# Patient Record
Sex: Female | Born: 1945 | Race: Black or African American | Hispanic: No | Marital: Married | Smoking: Former smoker
Health system: Southern US, Community
[De-identification: ages and names within clinical notes are randomized; demographics above are authoritative.]

## PROBLEM LIST (undated history)

## (undated) DIAGNOSIS — Z8489 Family history of other specified conditions: Secondary | ICD-10-CM

## (undated) DIAGNOSIS — R059 Cough, unspecified: Secondary | ICD-10-CM

## (undated) DIAGNOSIS — K219 Gastro-esophageal reflux disease without esophagitis: Secondary | ICD-10-CM

## (undated) DIAGNOSIS — D649 Anemia, unspecified: Secondary | ICD-10-CM

## (undated) DIAGNOSIS — R05 Cough: Secondary | ICD-10-CM

## (undated) DIAGNOSIS — E785 Hyperlipidemia, unspecified: Secondary | ICD-10-CM

## (undated) DIAGNOSIS — J45909 Unspecified asthma, uncomplicated: Secondary | ICD-10-CM

## (undated) HISTORY — DX: Cough, unspecified: R05.9

## (undated) HISTORY — DX: Hyperlipidemia, unspecified: E78.5

## (undated) HISTORY — PX: ABDOMINAL HYSTERECTOMY: SHX81

## (undated) HISTORY — DX: Gastro-esophageal reflux disease without esophagitis: K21.9

## (undated) HISTORY — DX: Cough: R05

---

## 1999-12-04 ENCOUNTER — Encounter: Payer: Self-pay | Admitting: Internal Medicine

## 1999-12-04 ENCOUNTER — Other Ambulatory Visit: Admission: RE | Admit: 1999-12-04 | Discharge: 1999-12-04 | Payer: Self-pay | Admitting: Gynecology

## 1999-12-04 ENCOUNTER — Encounter: Admission: RE | Admit: 1999-12-04 | Discharge: 1999-12-04 | Payer: Self-pay | Admitting: Internal Medicine

## 2000-12-24 ENCOUNTER — Other Ambulatory Visit: Admission: RE | Admit: 2000-12-24 | Discharge: 2000-12-24 | Payer: Self-pay | Admitting: Gynecology

## 2000-12-24 ENCOUNTER — Encounter: Payer: Self-pay | Admitting: Internal Medicine

## 2000-12-24 ENCOUNTER — Encounter: Admission: RE | Admit: 2000-12-24 | Discharge: 2000-12-24 | Payer: Self-pay | Admitting: Internal Medicine

## 2002-01-18 ENCOUNTER — Other Ambulatory Visit: Admission: RE | Admit: 2002-01-18 | Discharge: 2002-01-18 | Payer: Self-pay | Admitting: Gynecology

## 2002-04-05 ENCOUNTER — Encounter: Payer: Self-pay | Admitting: Gynecology

## 2002-04-05 ENCOUNTER — Encounter: Admission: RE | Admit: 2002-04-05 | Discharge: 2002-04-05 | Payer: Self-pay | Admitting: Gynecology

## 2003-03-31 ENCOUNTER — Other Ambulatory Visit: Admission: RE | Admit: 2003-03-31 | Discharge: 2003-03-31 | Payer: Self-pay | Admitting: Gynecology

## 2003-04-27 ENCOUNTER — Encounter: Admission: RE | Admit: 2003-04-27 | Discharge: 2003-04-27 | Payer: Self-pay | Admitting: Internal Medicine

## 2003-04-27 ENCOUNTER — Encounter: Payer: Self-pay | Admitting: Internal Medicine

## 2003-12-27 ENCOUNTER — Encounter: Payer: Self-pay | Admitting: Critical Care Medicine

## 2004-02-17 ENCOUNTER — Ambulatory Visit (HOSPITAL_COMMUNITY): Admission: RE | Admit: 2004-02-17 | Discharge: 2004-02-17 | Payer: Self-pay | Admitting: Internal Medicine

## 2004-05-17 ENCOUNTER — Other Ambulatory Visit: Admission: RE | Admit: 2004-05-17 | Discharge: 2004-05-17 | Payer: Self-pay | Admitting: Gynecology

## 2004-05-25 ENCOUNTER — Encounter: Admission: RE | Admit: 2004-05-25 | Discharge: 2004-05-25 | Payer: Self-pay | Admitting: Gynecology

## 2005-04-30 ENCOUNTER — Other Ambulatory Visit: Admission: RE | Admit: 2005-04-30 | Discharge: 2005-04-30 | Payer: Self-pay | Admitting: Gynecology

## 2005-07-05 ENCOUNTER — Encounter: Admission: RE | Admit: 2005-07-05 | Discharge: 2005-07-05 | Payer: Self-pay | Admitting: Internal Medicine

## 2005-07-10 ENCOUNTER — Encounter: Admission: RE | Admit: 2005-07-10 | Discharge: 2005-07-10 | Payer: Self-pay | Admitting: Interventional Cardiology

## 2005-07-11 ENCOUNTER — Inpatient Hospital Stay (HOSPITAL_BASED_OUTPATIENT_CLINIC_OR_DEPARTMENT_OTHER): Admission: RE | Admit: 2005-07-11 | Discharge: 2005-07-11 | Payer: Self-pay | Admitting: Interventional Cardiology

## 2005-08-13 ENCOUNTER — Encounter: Admission: RE | Admit: 2005-08-13 | Discharge: 2005-08-13 | Payer: Self-pay | Admitting: Internal Medicine

## 2005-12-23 HISTORY — PX: OTHER SURGICAL HISTORY: SHX169

## 2006-03-13 ENCOUNTER — Ambulatory Visit: Payer: Self-pay | Admitting: Critical Care Medicine

## 2006-05-20 ENCOUNTER — Other Ambulatory Visit: Admission: RE | Admit: 2006-05-20 | Discharge: 2006-05-20 | Payer: Self-pay | Admitting: Gynecology

## 2006-08-22 ENCOUNTER — Encounter: Admission: RE | Admit: 2006-08-22 | Discharge: 2006-08-22 | Payer: Self-pay | Admitting: Gynecology

## 2007-05-27 ENCOUNTER — Other Ambulatory Visit: Admission: RE | Admit: 2007-05-27 | Discharge: 2007-05-27 | Payer: Self-pay | Admitting: Gynecology

## 2007-07-13 ENCOUNTER — Encounter (INDEPENDENT_AMBULATORY_CARE_PROVIDER_SITE_OTHER): Payer: Self-pay | Admitting: Gynecology

## 2007-07-13 ENCOUNTER — Ambulatory Visit (HOSPITAL_COMMUNITY): Admission: RE | Admit: 2007-07-13 | Discharge: 2007-07-14 | Payer: Self-pay | Admitting: Gynecology

## 2007-09-15 ENCOUNTER — Encounter: Admission: RE | Admit: 2007-09-15 | Discharge: 2007-09-15 | Payer: Self-pay | Admitting: Gynecology

## 2007-12-24 HISTORY — PX: FOOT SURGERY: SHX648

## 2008-09-27 ENCOUNTER — Encounter: Admission: RE | Admit: 2008-09-27 | Discharge: 2008-09-27 | Payer: Self-pay | Admitting: Gynecology

## 2009-10-19 ENCOUNTER — Encounter: Admission: RE | Admit: 2009-10-19 | Discharge: 2009-10-19 | Payer: Self-pay | Admitting: *Deleted

## 2010-02-02 ENCOUNTER — Encounter: Admission: RE | Admit: 2010-02-02 | Discharge: 2010-02-02 | Payer: Self-pay | Admitting: Gastroenterology

## 2010-03-26 DIAGNOSIS — K219 Gastro-esophageal reflux disease without esophagitis: Secondary | ICD-10-CM | POA: Insufficient documentation

## 2010-03-26 DIAGNOSIS — Z8679 Personal history of other diseases of the circulatory system: Secondary | ICD-10-CM | POA: Insufficient documentation

## 2010-03-26 DIAGNOSIS — E785 Hyperlipidemia, unspecified: Secondary | ICD-10-CM | POA: Insufficient documentation

## 2010-03-27 ENCOUNTER — Ambulatory Visit: Payer: Self-pay | Admitting: Critical Care Medicine

## 2010-03-27 DIAGNOSIS — J329 Chronic sinusitis, unspecified: Secondary | ICD-10-CM | POA: Insufficient documentation

## 2010-05-09 ENCOUNTER — Ambulatory Visit: Payer: Self-pay | Admitting: Critical Care Medicine

## 2010-10-24 ENCOUNTER — Encounter: Admission: RE | Admit: 2010-10-24 | Discharge: 2010-10-24 | Payer: Self-pay | Admitting: *Deleted

## 2011-01-13 ENCOUNTER — Encounter: Payer: Self-pay | Admitting: Gastroenterology

## 2011-01-22 NOTE — Assessment & Plan Note (Signed)
Summary: Pulmonary OV   Copy to:  Dr. Sharene Butters Primary Provider/Referring Provider:  Dr. Sharene Butters  CC:  1 month sinus follow up.  Pt states sinuses are better.  Denies any complaints today.Marland Kitchen  History of Present Illness: [name] with chronic sinusitis.  This pt was seen previously in 2005 for chronic sinusitis.   Pt developed recurrent symptoms recently and rx by PCP 11/10 with augmentin for sinusitis. This improved only for pt to recurr this spring.   She has seen GI and EGD neg for reflux. She was on PPI now off.  She notes sore throat, cough, nasal congestion, thick yellow mucous out of nose,  no dyspnea. She sleeps on two pillows.    Pt referred by PCP for sinus reeval. Pt denies any increase in rescue therapy over baseline, denies waking up needing it or having any early am or nocturnal exacerbations of coughing/wheezing/or dyspnea. Pt denies any significant  fever, chills, sweats, unintended weight loss, pleurtic or exertional chest pain, orthopnea PND, or leg swelling   May 09, 2010 3:14 PM Is better and has less cough.  Now not much nasal drainage  No new issues Pt denies any significant sore throat, nasal congestion or excess secretions, fever, chills, sweats, unintended weight loss, pleurtic or exertional chest pain, orthopnea PND, or leg swelling Pt denies any increase in rescue therapy over baseline, denies waking up needing it or having any early am or nocturnal exacerbations of coughing/wheezing/or dyspnea.   Preventive Screening-Counseling & Management  Alcohol-Tobacco     Smoking Status: quit > 6 months  Current Medications (verified): 1)  Aspir-Low 81 Mg Tbec (Aspirin) .... Once Daily 2)  Pravastatin Sodium 40 Mg Tabs (Pravastatin Sodium) .... Once Daily 3)  Multivitamins  Tabs (Multiple Vitamin) .... Once Daily 4)  Calcium-Vitamin D 600-200 Mg-Unit Tabs (Calcium-Vitamin D) .... Once Daily 5)  Nasonex 50 Mcg/act  Susp (Mometasone Furoate) .... Two Puffs  Each Nostril Daily  Allergies (verified): No Known Drug Allergies  Past History:  Past medical, surgical, family and social histories (including risk factors) reviewed, and no changes noted (except as noted below).  Past Medical History: Reviewed history from 03/26/2010 and no changes required. GERD (ICD-530.81) CHEST PAIN, HX OF (ICD-V12.50) HYPERLIPIDEMIA (ICD-272.4) Divertiulosis  Past Surgical History: Reviewed history from 03/27/2010 and no changes required. hysterectomy July 2009 foot surgery 2010  Family History: Reviewed history from 03/27/2010 and no changes required. Father- deceased:  HTN, PNA, Alzheimers Mother- Deceased:  stroke, HTN sister 1 - CAD, HTN Daughter - HTN Son - Bipolar disorder Daughter, son- allergies Granddaughter-asthma  Social History: Reviewed history from 03/27/2010 and no changes required. Married Unemployed Patient states former smoker.   Quit 1980's.  < 1/2 ppd x 85yrs Smoking Status:  quit > 6 months  Review of Systems  The patient denies shortness of breath with activity, shortness of breath at rest, productive cough, non-productive cough, coughing up blood, chest pain, irregular heartbeats, acid heartburn, indigestion, loss of appetite, weight change, abdominal pain, difficulty swallowing, sore throat, tooth/dental problems, headaches, nasal congestion/difficulty breathing through nose, sneezing, itching, ear ache, anxiety, depression, hand/feet swelling, joint stiffness or pain, rash, change in color of mucus, and fever.    Vital Signs:  Patient profile:   65 year old female Height:      62 inches Weight:      188 pounds BMI:     34.51 O2 Sat:      98 % on Room air Temp:  97.4 degrees F oral Pulse rate:   86 / minute BP sitting:   118 / 72  (right arm) Cuff size:   regular  Vitals Entered By: Gweneth Dimitri RN (May 09, 2010 3:08 PM)  O2 Flow:  Room air CC: 1 month sinus follow up.  Pt states sinuses are better.  Denies  any complaints today. Comments Medications reviewed with patient Daytime contact number verified with patient. Gweneth Dimitri RN  May 09, 2010 3:10 PM    Physical Exam  Additional Exam:  Gen: Pleasant, well-nourished, in no distress,  normal affect ENT: No lesions,  mouth clear,  oropharynx  posterior nasal drip, nares with reduced erythema and resolved purulence in nares Neck: No JVD, no TMG, no carotid bruits Lungs: No use of accessory muscles, no dullness to percussion, clear without rales or rhonchi Cardiovascular: RRR, heart sounds normal, no murmur or gallops, no peripheral edema Abdomen: soft and NT, no HSM,  BS normal Musculoskeletal: No deformities, no cyanosis or clubbing Neuro: alert, non focal Skin: Warm, no lesions or rashes    Impression & Recommendations:  Problem # 1:  SINUSITIS (ICD-473.9) Assessment Improved  Acute on chronic sinusitis due to allergic rhinitis improved  Note normal spirometry thus doubt any primary lung disease plan cont nasal hygiene no further ABX  consider ENT eval if recurs sinusitis  Complete Medication List: 1)  Aspir-low 81 Mg Tbec (Aspirin) .... Once daily 2)  Pravastatin Sodium 40 Mg Tabs (Pravastatin sodium) .... Once daily 3)  Multivitamins Tabs (Multiple vitamin) .... Once daily 4)  Calcium-vitamin D 600-200 Mg-unit Tabs (Calcium-vitamin d) .... Once daily 5)  Nasonex 50 Mcg/act Susp (Mometasone furoate) .... Two puffs each nostril daily  Other Orders: Est. Patient Level III (57322)  Patient Instructions: 1)  When you finish the current nasonex stay off and see how you do 2)  Return as needed  Appended Document: Pulmonary OV fax Dr Sharene Butters  [facility]

## 2011-01-22 NOTE — Assessment & Plan Note (Signed)
Summary: Pulmonary OV   Copy to:  Dr. Sharene Boyd Primary Provider/Referring Provider:  Dr. Sharene Boyd  CC:  Pulmonary Consult.Marland Kitchen  History of Present Illness: [name] with chronic sinusitis.  This pt was seen previously in 2005 for chronic sinusitis.   Pt developed recurrent symptoms recently and rx by PCP 11/10 with augmentin for sinusitis. This improved only for pt to recurr this spring.   She has seen GI and EGD neg for reflux. She was on PPI now off.  She notes sore throat, cough, nasal congestion, thick yellow mucous out of nose,  no dyspnea. She sleeps on two pillows.    Pt referred by PCP for sinus reeval. Pt denies any increase in rescue therapy over baseline, denies waking up needing it or having any early am or nocturnal exacerbations of coughing/wheezing/or dyspnea. Pt denies any significant  fever, chills, sweats, unintended weight loss, pleurtic or exertional chest pain, orthopnea PND, or leg swelling     Preventive Screening-Counseling & Management  Alcohol-Tobacco     Smoking Status: quit     Year Quit: 1970  Current Medications (verified): 1)  Aspir-Low 81 Mg Tbec (Aspirin) .... Once Daily 2)  Pravastatin Sodium 40 Mg Tabs (Pravastatin Sodium) .... Once Daily 3)  Multivitamins  Tabs (Multiple Vitamin) .... Once Daily 4)  Calcium-Vitamin D 600-200 Mg-Unit Tabs (Calcium-Vitamin D) .... Once Daily  Allergies (verified): No Known Drug Allergies  Past History:  Past medical, surgical, family and social histories (including risk factors) reviewed, and no changes noted (except as noted below).  Past Medical History: Reviewed history from 03/26/2010 and no changes required. GERD (ICD-530.81) CHEST PAIN, HX OF (ICD-V12.50) HYPERLIPIDEMIA (ICD-272.4) Divertiulosis  Past Surgical History: hysterectomy July 2009 foot surgery 2010  Family History: Reviewed history from 03/26/2010 and no changes required. Father- deceased:  HTN, PNA, Alzheimers Mother-  Deceased:  stroke, HTN sister 1 - CAD, HTN Daughter - HTN Son - Bipolar disorder Daughter, son- allergies Granddaughter-asthma  Social History: Reviewed history from 03/26/2010 and no changes required. Married Unemployed Patient states former smoker.   Quit 1980's.  < 1/2 ppd x 43yrs Smoking Status:  quit  Review of Systems       The patient complains of shortness of breath with activity, productive cough, non-productive cough, difficulty swallowing, sore throat, nasal congestion/difficulty breathing through nose, sneezing, and change in color of mucus.  The patient denies shortness of breath at rest, coughing up blood, chest pain, irregular heartbeats, acid heartburn, indigestion, loss of appetite, weight change, abdominal pain, tooth/dental problems, headaches, itching, ear ache, anxiety, depression, hand/feet swelling, joint stiffness or pain, rash, and fever.    Vital Signs:  Patient profile:   65 year old female Height:      62 inches Weight:      184.50 pounds BMI:     33.87 O2 Sat:      99 % on Room air Temp:     97.7 degrees F oral Pulse rate:   102 / minute BP sitting:   122 / 70  (left arm) Cuff size:   regular  Vitals Entered By: Doris Dimitri RN (March 27, 2010 3:48 PM)  O2 Flow:  Room air CC: Pulmonary Consult. Comments Medications reviewed with patient Daytime contact number verified with patient. Doris Dimitri RN  March 27, 2010 3:47 PM    Physical Exam  Additional Exam:  Gen: Pleasant, well-nourished, in no distress,  normal affect ENT: No lesions,  mouth clear,  oropharynx  posterior nasal  drip, nares with erythema and purulence R>L nares Neck: No JVD, no TMG, no carotid bruits Lungs: No use of accessory muscles, no dullness to percussion, clear without rales or rhonchi Cardiovascular: RRR, heart sounds normal, no murmur or gallops, no peripheral edema Abdomen: soft and NT, no HSM,  BS normal Musculoskeletal: No deformities, no cyanosis or  clubbing Neuro: alert, non focal Skin: Warm, no lesions or rashes    Pulmonary Function Test Date: 03/27/2010 4:18 PM Gender: Female  Pre-Spirometry FVC    Value: 1.98 L/min   % Pred: 83.80 % FEV1    Value: 1.43 L     Pred: 1.85 L     % Pred: 77.60 % FEV1/FVC  Value: 72.37 %     % Pred: 91.60 %  Impression & Recommendations:  Problem # 1:  SINUSITIS (ICD-473.9) Assessment Deteriorated Acute on chronic sinusitis due to allergic rhinitis Note normal spirometry thus doubt any primary lung disease The pt has not seen ENT plan Prednisone 10mg  4 each am x3days, 3 x 3days, 2 x 3days, 1 x 3days then stop Nasonex two sprays each nostril daily augmentin one twice daily for 7 days Use the NeilMed nasal rinse at least daily   Medications Added to Medication List This Visit: 1)  Aspir-low 81 Mg Tbec (Aspirin) .... Once daily 2)  Pravastatin Sodium 40 Mg Tabs (Pravastatin sodium) .... Once daily 3)  Multivitamins Tabs (Multiple vitamin) .... Once daily 4)  Calcium-vitamin D 600-200 Mg-unit Tabs (Calcium-vitamin d) .... Once daily 5)  Amoxicillin-pot Clavulanate 875-125 Mg Tabs (Amoxicillin-pot clavulanate) .... One by mouth two times a day 6)  Prednisone 10 Mg Tabs (Prednisone) .... Take as directed 4 each am x3days, 3 x 3days, 2 x 3days, 1 x 3days then stop 7)  Nasonex 50 Mcg/act Susp (Mometasone furoate) .... Two puffs each nostril daily  Complete Medication List: 1)  Aspir-low 81 Mg Tbec (Aspirin) .... Once daily 2)  Pravastatin Sodium 40 Mg Tabs (Pravastatin sodium) .... Once daily 3)  Multivitamins Tabs (Multiple vitamin) .... Once daily 4)  Calcium-vitamin D 600-200 Mg-unit Tabs (Calcium-vitamin d) .... Once daily 5)  Amoxicillin-pot Clavulanate 875-125 Mg Tabs (Amoxicillin-pot clavulanate) .... One by mouth two times a day 6)  Prednisone 10 Mg Tabs (Prednisone) .... Take as directed 4 each am x3days, 3 x 3days, 2 x 3days, 1 x 3days then stop 7)  Nasonex 50 Mcg/act Susp  (Mometasone furoate) .... Two puffs each nostril daily  Other Orders: New Patient Level V (16109)  Patient Instructions: 1)  Prednisone 10mg  4 each am x3days, 3 x 3days, 2 x 3days, 1 x 3days then stop 2)  Nasonex two sprays each nostril daily 3)  augmentin one twice daily for 7 days 4)  Use the NeilMed nasal rinse at least daily washing out both nares thoroughly.  Place one packet of Sinus Wash ingredients into the nasal wash bottle then fill to the dotted line with lukewarm tap water.  Lean over the sink and rinse each nostril out thoroughly and avoid letting the rinse go into the throat.   5)  Return one month Prescriptions: NASONEX 50 MCG/ACT  SUSP (MOMETASONE FUROATE) Two puffs each nostril daily  #1 x 6   Entered and Authorized by:   Storm Frisk MD   Signed by:   Storm Frisk MD on 03/27/2010   Method used:   Electronically to        [facility]  [address] [phone]* (retail)  [address]       [place], [place]  [address]       Ph: [phone]       Fax: [phone]   RxID:   [id number] PREDNISONE 10 MG  TABS (PREDNISONE) Take as directed 4 each am x3days, 3 x 3days, 2 x 3days, 1 x 3days then stop  #30 x 0   Entered and Authorized by:   Storm Frisk MD   Signed by:   Storm Frisk MD on 03/27/2010   Method used:   Electronically to        [facility]  [address] [phone]* (retail)       [address]       [place], [place]  [address]       Ph: [phone]       Fax: [phone]   RxID:   [id number] AMOXICILLIN-POT CLAVULANATE 875-125 MG TABS (AMOXICILLIN-POT CLAVULANATE) One by mouth two times a day  #7 x 0   Entered and Authorized by:   Storm Frisk MD   Signed by:   Storm Frisk MD on 03/27/2010   Method used:   [facility]        [facility]  [address] [phone]* (retail)       [address]       [place], [place]  [address]       Ph: [phone]       Fax: [phone]    RxID:   [id number]    Immunization History:  Influenza Immunization History:    Influenza:  historical (11/22/2009)    CardioPerfect Spirometry  ID: [id number] Patient: Doris Boyd, Doris Boyd DOB: March 14, 1946 Age: 65 Years Old Sex: Female Race: Black Physician: Raneen Jaffer Height: 62 Weight: 184.50 Status: Confirmed Past Medical History:  GERD (ICD-530.81) CHEST PAIN, HX OF (ICD-V12.50) HYPERLIPIDEMIA (ICD-272.4) Divertiulosis   Recorded: 03/27/2010 4:18 PM  Parameter  Measured Predicted %Predicted FVC     1.98        2.36        83.80 FEV1     1.43        1.85        77.60 FEV1%   72.37        79.01        91.60 PEF    5.11        5.11        99.80   Comments: Mild restriction, no obstruction  Interpretation: Pre: FVC= 1.98L FEV1= 1.43L FEV1%= 72.4% 1.43/1.98 FEV1/FVC (03/27/2010 4:24:19 PM), Within normal limits     Appended Document: Pulmonary OV fax [facility]

## 2011-01-22 NOTE — Progress Notes (Signed)
Summary: [facility]   Imported By: Sherian Rein 03/28/2010 10:33:19  _____________________________________________________________________  External Attachment:    Type:   Image     Comment:   External Document

## 2011-05-07 NOTE — Op Note (Signed)
NAME:  Doris Boyd, AXTON NO.:  [id number]   MEDICAL RECORD NO.:  [id number]          PATIENT TYPE:  AMB   LOCATION:  DAY                          FACILITY:  [facility]   PHYSICIAN:  Gretta Cool, M.D. DATE OF BIRTH:  [birth year]   DATE OF PROCEDURE:  07/13/2007  DATE OF DISCHARGE:                               OPERATIVE REPORT   PREOPERATIVE DIAGNOSIS:  Multiple uterine leiomyomata, with enlargement  and continued postmenopausal bleeding.   POSTOPERATIVE DIAGNOSIS:  Multiple uterine leiomyomata, with enlargement  and continued postmenopausal bleeding.   PROCEDURE:  Supracervical hysterectomy, bilateral salpingo-oophorectomy.   SURGEON:  Gretta Cool, M.D.   ASSISTANT:  Almedia Balls. Randell Patient, M.D.   ANESTHESIA:  General orotracheal.   DESCRIPTION OF PROCEDURE:  Under excellent general anesthesia, the  patient was prepped and draped in lithotomy position and with Foley  catheter draining her bladder.  A Pfannenstiel incision was made and  extended through the fascia.  The rectus muscle was then separated in  midline and the peritoneum opened.  There were no abnormalities  identified in the upper abdomen.  Examination of the pelvis revealed  multiple uterine leiomyomata distorting the fundus and body of the  uterus.  They were not involving the cervix or any other abnormality.  No endometriosis or other concerns. Both ovaries were quite small and  inactive in appearance.  At this point, attention was turned to the  hysterectomy.  There was minimal scarring from her previous cesarean  section delivery.  The round ligaments were transected by cautery.  The  anterior leaf of the broad ligament was then opened and the bladder  pushed off the lower segment.  The infundibulopelvic vessels were then  skeletonized, clamped, cut, sutured, and tied with 0 Vicryl.  A second  free tie was used to doubly ligate into the infundibulopelvic.  At this  point, the uterine vessels were  clamped, cut, sutured, and tied with 0  Vicryl.  The cardinal and uterosacral upper portion was then clamped was  straight Masterson clamps, cut, sutured, and tied.  At this point, the  cervix was incised in reverse cone fashion so as to remove most of the  central core of the cervix but leave the structure of the cervix intact.  At this point, the uterine cervix was treated by cautery in the  endocervical canal so as to eliminate any residual endometrial tissue.  The cervix was then closed with a running suture of #0 Vicryl.  At this  point, all the bleeding was well controlled.  The pelvic peritoneum was  then closed with a running suture of 2-0 Monocryl.  The pelvis was  irrigated with lactated Ringer's to remove any debris.  There was no  residual bleeding.  At this point, the abdominal peritoneum was closed  with a running suture of 0 Monocryl.  The rectus muscles were then  plicated in the midline with the same suture of 0 Monocryl.  The fascia  was approximated from each angle to the midline with a running  suture of #0 Vicryl.  The subcutaneous tissue was  approximated with  interrupted sutures of 3-0 Vicryl.  Skin closed with skin staples and  Steri-Strips.  At the end of the procedure, sponge and lap counts were  correct.  There were no complications.  The patient  returned to the  recovery room in excellent condition.           ______________________________  Gretta Cool, M.D.     CWL/MEDQ  D:  07/13/2007  T:  07/13/2007  Job:  [id number]   cc:   Dr. Birder Robson  [facility]

## 2011-05-07 NOTE — H&P (Signed)
NAME:  Doris Boyd, Doris Boyd NO.:  [id number]   MEDICAL RECORD NO.:  [id number]          PATIENT TYPE:  AMB   LOCATION:  DAY                          FACILITY:  [facility]   PHYSICIAN:  Gretta Cool, M.D. DATE OF BIRTH:  February 28, 1946   DATE OF ADMISSION:  DATE OF DISCHARGE:                              HISTORY & PHYSICAL   CHIEF COMPLAINT:  Abnormal uterine bleeding.   HISTORY OF PRESENT ILLNESS:  A 65 year old, black, gravida 3, para 3  with recurrent abnormal uterine bleeding.  She has known uterine  leiomyomata.  She has had no bleeding since January 2006 until  recurrence of bleeding Apr 23, 2007.  She has known uterine leiomyomata.  On recent exam by ultrasound her fibroids are known to have grown  larger.  Because of the increase in size of her uterus, she is now  admitted for supracervical hysterectomy and salpingo-oophorectomy.  On  ultrasound exam, her endometrial thickness is only 2 mm, very difficult  to identify throughout much of the course of her uterus.  Neither ovary  can be visualized.   PAST MEDICAL HISTORY:  Usual childhood diseases without sequelae.   MEDICAL ILLNESSES:  None.   HOSPITALIZATIONS:  For childbirth only.   PRESENT MEDICATIONS:  Prevacid one b.i.d., Climara 0.25 and Prometrium  every other month.   PAST SURGICAL HISTORY:  Cesarean section delivery in 1981.   FAMILY HISTORY:  Father is living in good health.  Mother had stroke at  age 76 and died of the stroke.  One sister has coronary artery disease,  one son died of congenital heart disease.  No other known familial  tendency.   ALLERGIES:  CIPRO.   REVIEW OF SYSTEMS:  HEENT: Denies symptoms. CARDIORESPIRATORY:  Denies  asthma, cough, bronchitis, shortness of breath.  GI/GU:  Denies  frequency, urgency, dysuria, change in bowel habits or food intolerance.   PHYSICAL EXAM:  GENERAL:  A well-developed, well-nourished, black  female, BMI of 34 at 5 feet 2 and 186 pounds.  HEENT:   Pupils equal round and reactive to light and accommodation.  Fundi not examined.  Oropharynx clear.  NECK:  Supple without mass or enlargement.  CHEST:  Clear P to A.  HEART:  Regular rhythm without murmur or cardiac enlargement.  ABDOMEN:  Soft with an enlarged panniculus.  She has no palpable  organomegaly.  PELVIC:  External genitalia normal female. Vagina is thin, diminished  estrogen effect.  Cervix is parous, clean.  Uterus is approximately 10-  12 weeks size, grossly knobby and irregular. Rectovaginal exam confirms.  EXTREMITIES:  Negative.  NEUROLOGIC:  Physiologic.   IMPRESSION:  1. Abnormal uterine bleeding, recurrent secondary to uterine      leiomyomata.  No evidence of endometrial pathology.  2. Persistent intense perimenopausal symptoms on low-dose HT.   PLAN:  Supracervical hysterectomy, salpingo-oophorectomy, possible total  hysterectomy.           ______________________________  Gretta Cool, M.D.     CWL/MEDQ  D:  07/12/2007  T:  07/13/2007  Job:  [id number]   cc:   Charise Carwin  Deboraha Sprang, MD

## 2011-05-10 NOTE — Cardiovascular Report (Signed)
NAME:  Doris Boyd, Doris Boyd NO.:  [id number]   MEDICAL RECORD NO.:  [id number]          PATIENT TYPE:  OIB   LOCATION:  6501                         FACILITY:  [facility]   PHYSICIAN:  Lyn Records, M.D.   DATE OF BIRTH:  17-Sep-1946   DATE OF PROCEDURE:  07/11/2005  DATE OF DISCHARGE:                              CARDIAC CATHETERIZATION   INDICATIONS:  Recurring chest, throat, neck, and jaw discomfort lasting 5-30  minutes with qualitative features consistent with ischemic heart disease.  Because of the recurring nature of the discomfort, stress testing was not  performed and it was felt that coronary angiography and delineation of  coronary anatomy was the most appropriate test.   PROCEDURES PERFORMED:  1.  Left heart cath.  2.  Selective coronary angiography.  3.  Left ventriculography.   DESCRIPTION:  After informed consent, a 4-French sheath was placed in the  right femoral using the modified Seldinger technique. Xylocaine 2% was used  for local anesthetic infiltration. A 4-French A2 multipurpose catheter was  then used for hemodynamic recordings, left ventriculography by hand  injection, and selective left and right coronary angiography. The patient  tolerated the procedure without complications.   RESULTS:  1.  Hemodynamic data:      1.  Aortic pressure 101/62.      2.  Left ventricular pressure 110/3 mmHg.  2.  Left ventriculography: Left ventricular cavity size, systolic function,      and mitral valve function were normal. EF estimated to be 60%.  3.  Coronary angiography.      1.  Left main coronary: The left main is relatively short and is free of          any obstruction.      2.  Left anterior descending coronary: There is a large somewhat          tortuous vessel that wraps around the left ventricular apex. It          gives origin to two diagonal branches. The LAD is smooth and free of          obstructive lesions.      3.  Circumflex artery:  Circumflex artery is large and gives origin to          two dominant obtuse marginal's. The first obtuse marginal          trifurcates. The circumflex is normal.      4.  Right coronary: The right coronary is a relatively small vessel free          of any significant obstruction. No obstructive lesions are noted.          The right coronary gives to the PDA and two small left ventricular          branches.   CONCLUSION:  1.  Normal coronary arteries.  2.  Normal LV function.  3.  Recurrent chest discomfort likely related to esophageal or      musculoskeletal source. We have not totally excludes the possibility      Prinzmetal's angina but this seems  highly unlikely given the absence of      any coronary artery plaquing.      Lyn Records, M.D.  Electronically Signed     HWS/MEDQ  D:  07/11/2005  T:  07/11/2005  Job:  [id number]   cc:   Marcene Duos, M.D.  [facility] [address]  [place]  [place] [address]  Fax: [phone]

## 2011-10-07 LAB — HEMOGLOBIN AND HEMATOCRIT, BLOOD
HCT: 36.6
Hemoglobin: 11.7 — ABNORMAL LOW
Hemoglobin: 12.3

## 2011-11-06 ENCOUNTER — Other Ambulatory Visit: Payer: Self-pay | Admitting: Internal Medicine

## 2011-11-06 DIAGNOSIS — Z1231 Encounter for screening mammogram for malignant neoplasm of breast: Secondary | ICD-10-CM

## 2011-11-12 ENCOUNTER — Ambulatory Visit
Admission: RE | Admit: 2011-11-12 | Discharge: 2011-11-12 | Disposition: A | Discharge status: Home / Self Care | Payer: BC Managed Care – PPO | Source: Ambulatory Visit | Attending: Internal Medicine | Admitting: Internal Medicine

## 2011-11-12 DIAGNOSIS — Z1231 Encounter for screening mammogram for malignant neoplasm of breast: Secondary | ICD-10-CM

## 2011-11-19 ENCOUNTER — Other Ambulatory Visit: Payer: Self-pay | Admitting: Gynecology

## 2012-06-02 ENCOUNTER — Other Ambulatory Visit: Payer: Self-pay | Admitting: Family Medicine

## 2012-06-02 ENCOUNTER — Ambulatory Visit
Admission: RE | Admit: 2012-06-02 | Discharge: 2012-06-02 | Disposition: A | Discharge status: Home / Self Care | Payer: BC Managed Care – PPO | Source: Ambulatory Visit | Attending: Family Medicine | Admitting: Family Medicine

## 2012-06-02 DIAGNOSIS — W19XXXA Unspecified fall, initial encounter: Secondary | ICD-10-CM

## 2012-10-26 ENCOUNTER — Other Ambulatory Visit: Payer: Self-pay | Admitting: Family Medicine

## 2012-10-26 DIAGNOSIS — Z1231 Encounter for screening mammogram for malignant neoplasm of breast: Secondary | ICD-10-CM

## 2012-11-04 ENCOUNTER — Institutional Professional Consult (permissible substitution): Payer: BC Managed Care – PPO | Admitting: Critical Care Medicine

## 2012-11-09 ENCOUNTER — Institutional Professional Consult (permissible substitution): Payer: BC Managed Care – PPO | Admitting: Critical Care Medicine

## 2012-11-17 ENCOUNTER — Ambulatory Visit
Admission: RE | Admit: 2012-11-17 | Discharge: 2012-11-17 | Disposition: A | Discharge status: Home / Self Care | Payer: BC Managed Care – PPO | Source: Ambulatory Visit | Attending: Family Medicine | Admitting: Family Medicine

## 2012-11-17 DIAGNOSIS — Z1231 Encounter for screening mammogram for malignant neoplasm of breast: Secondary | ICD-10-CM

## 2012-11-24 ENCOUNTER — Encounter: Payer: Self-pay | Admitting: Critical Care Medicine

## 2012-11-25 ENCOUNTER — Ambulatory Visit (INDEPENDENT_AMBULATORY_CARE_PROVIDER_SITE_OTHER): Payer: BC Managed Care – PPO | Admitting: Critical Care Medicine

## 2012-11-25 ENCOUNTER — Encounter: Payer: Self-pay | Admitting: Critical Care Medicine

## 2012-11-25 VITALS — BP 124/70 | HR 72 | Temp 98.2°F | Ht 62.0 in | Wt 174.0 lb

## 2012-11-25 DIAGNOSIS — K219 Gastro-esophageal reflux disease without esophagitis: Secondary | ICD-10-CM

## 2012-11-25 DIAGNOSIS — R05 Cough: Secondary | ICD-10-CM

## 2012-11-25 DIAGNOSIS — R059 Cough, unspecified: Secondary | ICD-10-CM | POA: Insufficient documentation

## 2012-11-25 DIAGNOSIS — J309 Allergic rhinitis, unspecified: Secondary | ICD-10-CM

## 2012-11-25 MED ORDER — FAMOTIDINE 20 MG PO TABS: 20.0000 mg | ORAL_TABLET | Freq: Two times a day (BID) | ORAL | Status: DC

## 2012-11-25 MED ORDER — MOMETASONE FUROATE 50 MCG/ACT NA SUSP: 2.0000 | Freq: Every day | NASAL | Status: DC

## 2012-11-25 MED ORDER — BENZONATATE 100 MG PO CAPS: ORAL_CAPSULE | ORAL | Status: DC

## 2012-11-25 MED ORDER — CHLORPHENIRAMINE TANNATE 12 MG PO TABS: 12.0000 mg | ORAL_TABLET | Freq: Every day | ORAL | Status: DC

## 2012-11-25 MED ORDER — DEXTROMETHORPHAN POLISTIREX 30 MG/5ML PO LQCR: 60.0000 mg | ORAL | Status: DC | PRN

## 2012-11-25 NOTE — Assessment & Plan Note (Signed)
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

## 2012-11-25 NOTE — Progress Notes (Signed)
Subjective:    Patient ID: Doris Boyd, female    DOB: 1946/05/23, 66 y.o.   MRN: [id number]  Cough This is a chronic problem. The current episode started more than 1 year ago. The problem has been gradually worsening. The problem occurs every few hours. The cough is non-productive. Associated symptoms include heartburn, postnasal drip, rhinorrhea, a sore throat and shortness of breath. Pertinent negatives include no chest pain, chills, ear congestion, ear pain, fever, headaches, hemoptysis, myalgias, rash or wheezing. Associated symptoms comments: Hoarse.   . The symptoms are aggravated by cold air and stress. There is no history of asthma, bronchiectasis, bronchitis, COPD, emphysema, environmental allergies or pneumonia.   Past Medical History  Diagnosis Date  . GERD (gastroesophageal reflux disease)   . Chest pain   . Hyperlipidemia   . Diverticulosis   . Cough      Family History  Problem Relation Age of Onset  . Hypertension Father   . Alzheimer's disease Father   . Hypertension Sister   . Bipolar disorder Son      History   Social History  . Marital Status: Married    Spouse Name: N/A    Number of Children: N/A  . Years of Education: N/A   Occupational History  . Not on file.   Social History Main Topics  . Smoking status: Former Smoker -- 0.5 packs/day for 15 years    Types: Cigarettes    Quit date: 12/23/1978  . Smokeless tobacco: Never Used  . Alcohol Use: Not on file  . Drug Use: Not on file  . Sexually Active: Not on file   Other Topics Concern  . Not on file   Social History Narrative  . No narrative on file     Allergies  Allergen Reactions  . Ciprofloxacin     Muscle aches     Outpatient Prescriptions Prior to Visit  Medication Sig Dispense Refill  . aspirin 81 MG tablet Take 81 mg by mouth daily.      . Calcium Carbonate-Vitamin D (CALCIUM 600+D) 600-200 MG-UNIT TABS Take 1 tablet by mouth daily.      . Multiple Vitamin (MULTIVITAMIN)  capsule Take 1 capsule by mouth daily.      . [DISCONTINUED] mometasone (NASONEX) 50 MCG/ACT nasal spray Place 2 sprays into the nose daily.      . [DISCONTINUED] pravastatin (PRAVACHOL) 40 MG tablet Take 40 mg by mouth daily.       Last reviewed on 11/25/2012  9:22 AM by Storm Frisk, MD     Review of Systems  Constitutional: Negative for fever and chills.  HENT: Positive for sore throat, rhinorrhea and postnasal drip. Negative for ear pain.   Respiratory: Positive for cough and shortness of breath. Negative for apnea, hemoptysis, choking, chest tightness, wheezing and stridor.   Cardiovascular: Negative for chest pain.  Gastrointestinal: Positive for heartburn.  Musculoskeletal: Negative for myalgias.  Skin: Negative for rash.  Neurological: Negative for headaches.  Hematological: Negative for environmental allergies.       Objective:   Physical Exam Filed Vitals:   11/25/12 0904  BP: 124/70  Pulse: 72  Temp: 98.2 F (36.8 C)  TempSrc: Oral  Height: 5\' 2"  (1.575 m)  Weight: 174 lb (78.926 kg)  SpO2: 99%    Gen: Pleasant, well-nourished, in no distress,  normal affect  ENT: No lesions,  mouth clear,  oropharynx clear, ++ postnasal drip  Neck: No JVD, no TMG, no carotid bruits  Lungs:  No use of accessory muscles, no dullness to percussion, clear without rales or rhonchi  Cardiovascular: RRR, heart sounds normal, no murmur or gallops, no peripheral edema  Abdomen: soft and NT, no HSM,  BS normal  Musculoskeletal: No deformities, no cyanosis or clubbing  Neuro: alert, non focal  Skin: Warm, no lesions or rashes  No results found.        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 aspirin 81 MG tablet Take 81 mg by mouth daily.      . Calcium Carbonate-Vitamin D (CALCIUM 600+D) 600-200 MG-UNIT TABS Take 1 tablet by mouth daily.      . fenofibrate 160 MG tablet Take 160 mg by mouth daily.      Marland Kitchen ibuprofen (ADVIL,MOTRIN) 800 MG tablet Take 800 mg by mouth every 8 (eight) hours as needed.      . Multiple Vitamin (MULTIVITAMIN) capsule Take 1 capsule by mouth daily.      . phentermine 37.5 MG capsule Take 37.5 mg by mouth as needed.      . benzonatate (TESSALON) 100 MG capsule Use 1 every 4 hour as needed for cough  30 capsule  4  . Chlorpheniramine Tannate 12 MG TABS Take 1 tablet (12 mg total) by mouth at bedtime.  30 each  6  . dextromethorphan (DELSYM) 30 MG/5ML liquid Take 10 mLs (60 mg total) by mouth as needed for cough.  89 mL  0  . famotidine (PEPCID) 20 MG tablet Take 1 tablet (20 mg total) by mouth 2 (two) times daily.  30 tablet  0  . mometasone (NASONEX) 50 MCG/ACT nasal spray Place 2 sprays into the nose daily.  17 g  1  . mometasone (NASONEX) 50 MCG/ACT nasal spray Place 2 sprays into the nose daily.  7.5 g  0  . [DISCONTINUED] mometasone (NASONEX) 50 MCG/ACT nasal spray Place 2 sprays into the nose daily.      . [DISCONTINUED] pravastatin (PRAVACHOL) 40 MG tablet Take 40 mg by mouth daily.

## 2012-11-25 NOTE — Patient Instructions (Addendum)
Pepcid one twice daily Nasonex two puff daily each nostril Tessalon/delsym per cough protocol Follow reflux diet and cough protocol Chlorpheniramine at bedtime Return 2 weeks recheck with tammy parrett

## 2012-12-11 ENCOUNTER — Ambulatory Visit (INDEPENDENT_AMBULATORY_CARE_PROVIDER_SITE_OTHER): Payer: BC Managed Care – PPO | Admitting: Critical Care Medicine

## 2012-12-11 ENCOUNTER — Encounter: Payer: Self-pay | Admitting: Critical Care Medicine

## 2012-12-11 VITALS — BP 132/70 | HR 78 | Temp 97.9°F | Ht 62.0 in | Wt 174.0 lb

## 2012-12-11 DIAGNOSIS — R05 Cough: Secondary | ICD-10-CM

## 2012-12-11 DIAGNOSIS — R059 Cough, unspecified: Secondary | ICD-10-CM

## 2012-12-11 MED ORDER — FAMOTIDINE 20 MG PO TABS: 20.0000 mg | ORAL_TABLET | Freq: Every day | ORAL | Status: DC

## 2012-12-11 NOTE — Assessment & Plan Note (Signed)
Cyclic cough resolved  D/t GERD, postnasal drip syndrome  Plan  cont antihistamine/gerd rx/ prn tessalon rov prn

## 2012-12-11 NOTE — Progress Notes (Signed)
Subjective:    Patient ID: Doris Boyd, female    DOB: Jul 05, 1946, 66 y.o.   MRN: [id number]  Cough This is a chronic problem. The current episode started more than 1 year ago. The problem has been gradually worsening. The problem occurs every few hours. The cough is non-productive. Associated symptoms include heartburn, postnasal drip, rhinorrhea, a sore throat and shortness of breath. Pertinent negatives include no chest pain, chills, ear congestion, ear pain, fever, headaches, hemoptysis, myalgias, rash or wheezing. Associated symptoms comments: Hoarse.   . The symptoms are aggravated by cold air and stress. There is no history of asthma, bronchiectasis, bronchitis, COPD, emphysema, environmental allergies or pneumonia.   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  No mucus.        Past Medical History  Diagnosis Date  . GERD (gastroesophageal reflux disease)   . Chest pain   . Hyperlipidemia   . Diverticulosis   . Cough      Family History  Problem Relation Age of Onset  . Hypertension Father   . Alzheimer's disease Father   . Hypertension Sister   . Bipolar disorder Son      History   Social History  . Marital Status: Married    Spouse Name: N/A    Number of Children: N/A  . Years of Education: N/A   Occupational History  . Not on file.   Social History Main Topics  . Smoking status: Former Smoker -- 0.5 packs/day for 15 years    Types: Cigarettes    Quit date: 12/23/1978  . Smokeless tobacco: Never Used  . Alcohol Use: Not on file  . Drug Use: Not on file  . Sexually Active: Not on file    Other Topics Concern  . Not on file   Social History Narrative  . No narrative on file     Allergies  Allergen Reactions  . Ciprofloxacin     Muscle aches     Outpatient Prescriptions Prior to Visit  Medication Sig Dispense Refill  . aspirin 81 MG tablet Take 81 mg by mouth daily.      . benzonatate (TESSALON) 100 MG capsule Use 1 every 4 hour as needed for cough  30 capsule  4  . Calcium Carbonate-Vitamin D (CALCIUM 600+D) 600-200 MG-UNIT TABS Take 1 tablet by mouth daily.      . Chlorpheniramine Tannate 12 MG TABS Take 1 tablet (12 mg total) by mouth at bedtime.  30 each  6  . dextromethorphan (DELSYM) 30 MG/5ML liquid Take 10 mLs (60 mg total) by mouth as needed for cough.  89 mL  0  . fenofibrate 160 MG tablet Take 160 mg by mouth daily.      Marland Kitchen ibuprofen (ADVIL,MOTRIN) 800 MG tablet Take 800 mg by mouth every 8 (eight) hours as needed.      . mometasone (NASONEX) 50 MCG/ACT nasal spray Place 2 sprays into the nose daily.  17 g  1  . Multiple Vitamin (MULTIVITAMIN) capsule Take 1 capsule by mouth daily.      . phentermine 37.5 MG capsule Take 37.5 mg by mouth as needed.      . [  DISCONTINUED] famotidine (PEPCID) 20 MG tablet Take 1 tablet (20 mg total) by mouth 2 (two) times daily.  30 tablet  0  . [DISCONTINUED] mometasone (NASONEX) 50 MCG/ACT nasal spray Place 2 sprays into the nose daily.  7.5 g  0  Last reviewed on 12/11/2012  9:23 AM by Storm Frisk, MD     Review of Systems  Constitutional: Negative for fever and chills.  HENT: Positive for sore throat, rhinorrhea and postnasal drip. Negative for ear pain.   Respiratory: Positive for cough and shortness of breath. Negative for apnea, hemoptysis, choking, chest tightness, wheezing and stridor.   Cardiovascular: Negative for chest pain.  Gastrointestinal: Positive for heartburn.  Musculoskeletal: Negative for myalgias.  Skin: Negative for rash.  Neurological: Negative for headaches.  Hematological: Negative for  environmental allergies.       Objective:   Physical Exam  Filed Vitals:   12/11/12 0915  BP: 132/70  Pulse: 78  Temp: 97.9 F (36.6 C)  TempSrc: Oral  Height: 5\' 2"  (1.575 m)  Weight: 78.926 kg (174 lb)  SpO2: 97%    Gen: Pleasant, well-nourished, in no distress,  normal affect  ENT: No lesions,  mouth clear,  oropharynx clear, ++ postnasal drip  Neck: No JVD, no TMG, no carotid bruits  Lungs: No use of accessory muscles, no dullness to percussion, clear without rales or rhonchi  Cardiovascular: RRR, heart sounds normal, no murmur or gallops, no peripheral edema  Abdomen: soft and NT, no HSM,  BS normal  Musculoskeletal: No deformities, no cyanosis or clubbing  Neuro: alert, non focal  Skin: Warm, no lesions or rashes  No results found.        Assessment & Plan:   Cough Cyclic cough resolved  D/t GERD, postnasal drip syndrome  Plan  cont antihistamine/gerd rx/ prn tessalon rov prn     Updated Medication List Outpatient Encounter Prescriptions as of 12/11/2012  Medication Sig Dispense Refill  . aspirin 81 MG tablet Take 81 mg by mouth daily.      . benzonatate (TESSALON) 100 MG capsule Use 1 every 4 hour as needed for cough  30 capsule  4  . Calcium Carbonate-Vitamin D (CALCIUM 600+D) 600-200 MG-UNIT TABS Take 1 tablet by mouth daily.      . Chlorpheniramine Tannate 12 MG TABS Take 1 tablet (12 mg total) by mouth at bedtime.  30 each  6  . dextromethorphan (DELSYM) 30 MG/5ML liquid Take 10 mLs (60 mg total) by mouth as needed for cough.  89 mL  0  . famotidine (PEPCID) 20 MG tablet Take 1 tablet (20 mg total) by mouth at bedtime.  30 tablet  0  . fenofibrate 160 MG tablet Take 160 mg by mouth daily.      Marland Kitchen ibuprofen (ADVIL,MOTRIN) 800 MG tablet Take 800 mg by mouth every 8 (eight) hours as needed.      . mometasone (NASONEX) 50 MCG/ACT nasal spray Place 2 sprays into the nose daily.  17 g  1  . Multiple Vitamin (MULTIVITAMIN) capsule Take 1 capsule  by mouth daily.      . phentermine 37.5 MG capsule Take 37.5 mg by mouth as needed.      . [DISCONTINUED] famotidine (PEPCID) 20 MG tablet Take 1 tablet (20 mg total) by mouth 2 (two) times daily.  30 tablet  0  . [DISCONTINUED] mometasone (NASONEX) 50 MCG/ACT nasal spray Place 2 sprays into the nose daily.  7.5 g  0

## 2012-12-11 NOTE — Patient Instructions (Signed)
Use nasonex two puff daily each nostril for 7 more days then use as needed Reduce pepcid to 20mg  at bedtime Follow reflux diet Chlortrimetron 12mg  at bedtime for 7 more days then as needed Tessalon as needed for cough Return as needed

## 2013-02-07 ENCOUNTER — Other Ambulatory Visit: Payer: Self-pay | Admitting: Critical Care Medicine

## 2013-02-11 ENCOUNTER — Other Ambulatory Visit: Payer: Self-pay | Admitting: Gynecology

## 2013-04-22 ENCOUNTER — Encounter (HOSPITAL_COMMUNITY): Payer: Self-pay | Admitting: *Deleted

## 2013-04-22 ENCOUNTER — Encounter (HOSPITAL_COMMUNITY): Payer: Self-pay | Admitting: Pharmacy Technician

## 2013-04-23 ENCOUNTER — Other Ambulatory Visit: Payer: Self-pay | Admitting: Gastroenterology

## 2013-04-23 NOTE — Progress Notes (Signed)
lov note 03-25-2013 and ekg 03-25-2013 dr Sherilyn Cooter smityh cardiiology on chart

## 2013-04-26 ENCOUNTER — Encounter (HOSPITAL_COMMUNITY)
Admission: RE | Disposition: A | Discharge status: Home / Self Care | Payer: Self-pay | Source: Ambulatory Visit | Attending: Gastroenterology

## 2013-04-26 ENCOUNTER — Encounter (HOSPITAL_COMMUNITY): Payer: Self-pay

## 2013-04-26 ENCOUNTER — Encounter (HOSPITAL_COMMUNITY): Payer: Self-pay | Admitting: Anesthesiology

## 2013-04-26 ENCOUNTER — Ambulatory Visit (HOSPITAL_COMMUNITY)
Admission: RE | Admit: 2013-04-26 | Discharge: 2013-04-26 | Disposition: A | Discharge status: Home / Self Care | Payer: BC Managed Care – PPO | Source: Ambulatory Visit | Attending: Gastroenterology | Admitting: Gastroenterology

## 2013-04-26 ENCOUNTER — Ambulatory Visit (HOSPITAL_COMMUNITY): Payer: BC Managed Care – PPO | Admitting: Anesthesiology

## 2013-04-26 DIAGNOSIS — K5730 Diverticulosis of large intestine without perforation or abscess without bleeding: Secondary | ICD-10-CM | POA: Insufficient documentation

## 2013-04-26 DIAGNOSIS — R12 Heartburn: Secondary | ICD-10-CM | POA: Insufficient documentation

## 2013-04-26 DIAGNOSIS — IMO0002 Reserved for concepts with insufficient information to code with codable children: Secondary | ICD-10-CM | POA: Insufficient documentation

## 2013-04-26 DIAGNOSIS — Z79899 Other long term (current) drug therapy: Secondary | ICD-10-CM | POA: Insufficient documentation

## 2013-04-26 DIAGNOSIS — R109 Unspecified abdominal pain: Secondary | ICD-10-CM | POA: Insufficient documentation

## 2013-04-26 DIAGNOSIS — K921 Melena: Secondary | ICD-10-CM | POA: Insufficient documentation

## 2013-04-26 DIAGNOSIS — E785 Hyperlipidemia, unspecified: Secondary | ICD-10-CM | POA: Insufficient documentation

## 2013-04-26 DIAGNOSIS — Z7982 Long term (current) use of aspirin: Secondary | ICD-10-CM | POA: Insufficient documentation

## 2013-04-26 DIAGNOSIS — K219 Gastro-esophageal reflux disease without esophagitis: Secondary | ICD-10-CM | POA: Insufficient documentation

## 2013-04-26 HISTORY — PX: COLONOSCOPY WITH PROPOFOL: SHX5780

## 2013-04-26 HISTORY — PX: ESOPHAGOGASTRODUODENOSCOPY (EGD) WITH PROPOFOL: SHX5813

## 2013-04-26 SURGERY — ESOPHAGOGASTRODUODENOSCOPY (EGD) WITH PROPOFOL
Anesthesia: Monitor Anesthesia Care

## 2013-04-26 SURGERY — COLONOSCOPY WITH PROPOFOL
Anesthesia: Monitor Anesthesia Care

## 2013-04-26 MED ORDER — SODIUM CHLORIDE 0.9 % IV SOLN: INTRAVENOUS | Status: DC

## 2013-04-26 MED ORDER — BUTAMBEN-TETRACAINE-BENZOCAINE 2-2-14 % EX AERO
INHALATION_SPRAY | CUTANEOUS | Status: DC | PRN
  Administered 2013-04-26: 1 via TOPICAL

## 2013-04-26 MED ORDER — PROPOFOL 10 MG/ML IV EMUL
INTRAVENOUS | Status: DC | PRN
  Administered 2013-04-26 (×2): 20 mg via INTRAVENOUS
  Administered 2013-04-26: 40 mg via INTRAVENOUS

## 2013-04-26 MED ORDER — LACTATED RINGERS IV SOLN
INTRAVENOUS | Status: DC
  Administered 2013-04-26: 10:00:00 via INTRAVENOUS
  Administered 2013-04-26: 1000 mL via INTRAVENOUS

## 2013-04-26 MED ORDER — PROPOFOL 10 MG/ML IV EMUL
INTRAVENOUS | Status: DC | PRN
  Administered 2013-04-26: 140 ug/kg/min via INTRAVENOUS

## 2013-04-26 MED ORDER — KETAMINE HCL 10 MG/ML IJ SOLN
INTRAMUSCULAR | Status: DC | PRN
  Administered 2013-04-26 (×2): 10 mg via INTRAVENOUS

## 2013-04-26 SURGICAL SUPPLY — 25 items

## 2013-04-26 NOTE — Anesthesia Postprocedure Evaluation (Signed)
  Anesthesia Post Note  Patient: Doris Boyd  Procedure(s) Performed: Procedure(s) (LRB): ESOPHAGOGASTRODUODENOSCOPY (EGD) WITH PROPOFOL (N/A) COLONOSCOPY WITH PROPOFOL (N/A)  Anesthesia type: MAC  Patient location: PACU  Post pain: Pain level controlled  Post assessment: Post-op Vital signs reviewed  Last Vitals: BP 144/60  Temp(Src) 36.8 C (Oral)  Resp 16  SpO2 98%  Post vital signs: Reviewed  Level of consciousness: awake  Complications: No apparent anesthesia complications

## 2013-04-26 NOTE — Anesthesia Preprocedure Evaluation (Addendum)
Anesthesia Evaluation  Patient identified by MRN, date of birth, ID band Patient awake    Reviewed: Allergy & Precautions, H&P , NPO status , Patient's Chart, lab work & pertinent test results  Airway Mallampati: II TM Distance: >3 FB Neck ROM: Full    Dental no notable dental hx. (+) Dental Advisory Given   Pulmonary neg pulmonary ROS,  breath sounds clear to auscultation  Pulmonary exam normal       Cardiovascular negative cardio ROS  Rhythm:Regular Rate:Normal     Neuro/Psych negative neurological ROS  negative psych ROS   GI/Hepatic Neg liver ROS, GERD-  Medicated,  Endo/Other  negative endocrine ROS  Renal/GU negative Renal ROS     Musculoskeletal negative musculoskeletal ROS (+)   Abdominal   Peds  Hematology negative hematology ROS (+)   Anesthesia Other Findings   Reproductive/Obstetrics negative OB ROS                          Anesthesia Physical Anesthesia Plan  ASA: II  Anesthesia Plan: MAC   Post-op Pain Management:    Induction: Intravenous  Airway Management Planned: Simple Face Mask  Additional Equipment:   Intra-op Plan:   Post-operative Plan:   Informed Consent: I have reviewed the patients History and Physical, chart, labs and discussed the procedure including the risks, benefits and alternatives for the proposed anesthesia with the patient or authorized representative who has indicated his/her understanding and acceptance.   Dental advisory given  Plan Discussed with: CRNA  Anesthesia Plan Comments:         Anesthesia Quick Evaluation

## 2013-04-26 NOTE — Preoperative (Signed)
Beta Blockers   Reason not to administer Beta Blockers:Not Applicable 

## 2013-04-26 NOTE — H&P (Signed)
  Problem: Blood in the stool on aspirin and ibuprofen  History: The patient is a 67 year old female born 12/17/1946. The patient underwent a normal screening colonoscopy on 05/03/2005.  The patient's small-volume hematochezia unassociated with abdominal pain has resolved. She takes aspirin 81 mg daily and takes ibuprofen approximately once a week. The patient was prescribed antibiotics for a suspected sinus infection. She is experiencing daily heartburn without dysphagia or odynophagia. Last week, I gave her omeprazole 20 mg to be taken before breakfast and supper.  The patient is scheduled to undergo a diagnostic esophagogastroduodenoscopy and colonoscopy.  Chronic medications: Fenofibrate. Prednisone. Omeprazole. Aspirin. Ibuprofen.  Past medical and surgical history: Hyperlipidemia. Colonic diverticulosis. Gastroesophageal reflux. Hysterectomy. Foot surgery.  Medication allergies: Septra causes rash. Penicillin.  Habits: The patient quit smoking cigarettes in 1980. She does not consume alcohol  Exam: The patient is alert and lying comfortably on the endoscopy stretcher. Cardiac exam reveals a regular rhythm. Lungs are clear to auscultation. Abdomen is soft, flat, and nontender to palpation in all quadrants.  Plan: Proceed with diagnostic esophagogastroduodenoscopy and colonoscopy to evaluate resolved hematochezia and heartburn on aspirin and ibuprofen.

## 2013-04-26 NOTE — Transfer of Care (Signed)
Immediate Anesthesia Transfer of Care Note  Patient: Doris Boyd  Procedure(s) Performed: Procedure(s) (LRB): ESOPHAGOGASTRODUODENOSCOPY (EGD) WITH PROPOFOL (N/A) COLONOSCOPY WITH PROPOFOL (N/A)  Patient Location: PACU  Anesthesia Type: MAC  Level of Consciousness: sedated, patient cooperative and responds to stimulaton  Airway & Oxygen Therapy: Patient Spontanous Breathing and Patient connected to face mask oxgen  Post-op Assessment: Report given to PACU RN and Post -op Vital signs reviewed and stable  Post vital signs: Reviewed and stable  Complications: No apparent anesthesia complications

## 2013-04-26 NOTE — Pre-Procedure Instructions (Signed)
Procedure: Diagnostic esophagogastroduodenoscopy to evaluate heartburn  Endoscopist: Danise Edge  Premedication: Propofol administered by anesthesia  Procedure: The patient was placed in the left lateral decubitus position. The Pentax gastroscope was passed through the posterior hypopharynx into the proximal esophagus without difficulty.  Esophagoscopy: The proximal, mid, and lower segments of the esophageal mucosa appeared normal. The squamocolumnar junction is regular and noted at 40 cm from the incisor teeth. There is no endoscopic evidence for the presence of Barrett's esophagus or erosive esophagitis.  Gastroscopy: Retroflex view of the gastric cardia and fundus was normal. The gastric body, antrum, and pylorus appeared normal.  Duodenoscopy: The duodenal bulb and descending duodenum appeared normal.  Assessment: Normal esophagogastroduodenoscopy.  Procedure: Diagnostic colonoscopy to evaluate resolved, small-volume hematochezia.  Procedure: The Pentax colonoscope was introduced into the rectum and easily advanced to the cecum. A normal-appearing ileocecal valve and appendiceal orifice were identified. Ablation for the exam today was good. The patient has universal colonic diverticulosis.  Rectum. Normal. Retroflex view of the distal rectum normal.  Sigmoid colon and descending colon. Normal.  Splenic flexure. Normal.  Transverse colon. Normal.  Hepatic flexure. Normal.  Ascending colon. Normal.  Cecum and ileocecal valve. Normal.  Assessment: Normal diagnostic proctocolonoscopy to the cecum. Universal colonic diverticulosis present.  Recommendation: Schedule repeat screening colonoscopy in 10 years.

## 2013-04-27 ENCOUNTER — Encounter (HOSPITAL_COMMUNITY): Payer: Self-pay | Admitting: Gastroenterology

## 2013-10-28 ENCOUNTER — Other Ambulatory Visit: Payer: Self-pay | Admitting: Critical Care Medicine

## 2014-01-04 ENCOUNTER — Other Ambulatory Visit: Payer: Self-pay

## 2014-01-04 DIAGNOSIS — Z1231 Encounter for screening mammogram for malignant neoplasm of breast: Secondary | ICD-10-CM

## 2014-01-26 ENCOUNTER — Ambulatory Visit
Admission: RE | Admit: 2014-01-26 | Discharge: 2014-01-26 | Disposition: A | Discharge status: Home / Self Care | Payer: BC Managed Care – PPO | Source: Ambulatory Visit

## 2014-01-26 DIAGNOSIS — Z1231 Encounter for screening mammogram for malignant neoplasm of breast: Secondary | ICD-10-CM

## 2014-04-13 ENCOUNTER — Encounter: Payer: Self-pay | Admitting: Podiatry

## 2014-04-13 ENCOUNTER — Ambulatory Visit (INDEPENDENT_AMBULATORY_CARE_PROVIDER_SITE_OTHER): Payer: BC Managed Care – PPO | Admitting: Podiatry

## 2014-04-13 ENCOUNTER — Ambulatory Visit (INDEPENDENT_AMBULATORY_CARE_PROVIDER_SITE_OTHER): Payer: BC Managed Care – PPO

## 2014-04-13 VITALS — BP 153/88 | HR 74 | Resp 12

## 2014-04-13 DIAGNOSIS — M7660 Achilles tendinitis, unspecified leg: Secondary | ICD-10-CM

## 2014-04-13 DIAGNOSIS — M7730 Calcaneal spur, unspecified foot: Secondary | ICD-10-CM

## 2014-04-13 MED ORDER — TRIAMCINOLONE ACETONIDE 10 MG/ML IJ SUSP
10.0000 mg | Freq: Once | INTRAMUSCULAR | Status: AC
  Administered 2014-04-13: 10 mg

## 2014-04-13 NOTE — Patient Instructions (Signed)

## 2014-04-14 NOTE — Progress Notes (Signed)
Subjective:     Patient ID: Doris Boyd, female   DOB: [birth year], 68 y.o.   MRN: [id number]  HPI patient states that she is getting pain in the back of her left heel which has been going on for a few months and make it harder to walk comfortably. Does not remember specific injury   Review of Systems     Objective:   Physical Exam Neurovascular status intact with health history unchanged and muscle strength adequate with mild equinus condition noted and good Achilles tendon function. Pain posterior heel left lateral side with inflammation and fluid buildup    Assessment:     Achilles tendinitis left with history of surgery on the right Achilles tendon along with bone spur formation    Plan:     X-ray and H&P reviewed along with calcaneal spur and consideration for surgery long-term. We want to try to avoid as best as possible and I discussed injection therapy review with risk of injection. Patient wants procedure understanding risks and I injected 3 mg dexamethasone Kenalog 5 mg Xylocaine and advised on him mobilization with patient stating she has her boot at home which she will begin wearing today. Reduced activity and reappoint in 2 weeks

## 2014-08-22 ENCOUNTER — Ambulatory Visit (INDEPENDENT_AMBULATORY_CARE_PROVIDER_SITE_OTHER): Payer: BC Managed Care – PPO | Admitting: Podiatry

## 2014-08-22 ENCOUNTER — Encounter: Payer: Self-pay | Admitting: Podiatry

## 2014-08-22 ENCOUNTER — Ambulatory Visit (INDEPENDENT_AMBULATORY_CARE_PROVIDER_SITE_OTHER): Payer: BC Managed Care – PPO

## 2014-08-22 VITALS — BP 125/72 | HR 74 | Resp 16

## 2014-08-22 DIAGNOSIS — S93409A Sprain of unspecified ligament of unspecified ankle, initial encounter: Secondary | ICD-10-CM

## 2014-08-22 DIAGNOSIS — M7660 Achilles tendinitis, unspecified leg: Secondary | ICD-10-CM

## 2014-08-22 DIAGNOSIS — M7662 Achilles tendinitis, left leg: Secondary | ICD-10-CM

## 2014-08-22 DIAGNOSIS — M7750 Other enthesopathy of unspecified foot: Secondary | ICD-10-CM

## 2014-08-22 MED ORDER — TRIAMCINOLONE ACETONIDE 10 MG/ML IJ SUSP
10.0000 mg | Freq: Once | INTRAMUSCULAR | Status: AC
  Administered 2014-08-22: 10 mg

## 2014-08-22 NOTE — Progress Notes (Signed)
Subjective:     Patient ID: Doris Boyd, female   DOB: 27-May-1946, 68 y.o.   MRN: [id number]  HPI patient states that her left ankle has been hurting her for the last 4 weeks and that has become swollen and almost feels like she sprained it. Also states that the back of the heel hurts but not as severe as the ankle itself   Review of Systems     Objective:   Physical Exam Neurovascular status intact with moderate edema in the ankle region left with inflammation and pain around the sinus tarsi and moderate discomfort around the Achilles tendon    Assessment:     Inflammatory changes with sinus tarsitis left edema of the left lateral ankle and moderate Achilles tendinitis left    Plan:     H&P and x-rays reviewed. Discussed this problem and discussed sprained ankle Achilles tendinitis and sinus tarsitis. At this point I did inject the sinus tarsi 3 mm Kenalog 5 mg Xylocaine Marcaine mixture advised on Ace wrap and elevation and reappoint her recheck in the next several weeks

## 2014-09-12 ENCOUNTER — Encounter: Payer: Self-pay | Admitting: Podiatry

## 2014-09-12 ENCOUNTER — Ambulatory Visit (INDEPENDENT_AMBULATORY_CARE_PROVIDER_SITE_OTHER): Payer: BC Managed Care – PPO | Admitting: Podiatry

## 2014-09-12 ENCOUNTER — Ambulatory Visit: Payer: BC Managed Care – PPO | Admitting: Podiatry

## 2014-09-12 DIAGNOSIS — M7662 Achilles tendinitis, left leg: Secondary | ICD-10-CM

## 2014-09-12 DIAGNOSIS — M7660 Achilles tendinitis, unspecified leg: Secondary | ICD-10-CM

## 2014-09-12 MED ORDER — TRIAMCINOLONE ACETONIDE 10 MG/ML IJ SUSP
10.0000 mg | Freq: Once | INTRAMUSCULAR | Status: AC
  Administered 2014-09-12: 10 mg

## 2014-09-12 NOTE — Progress Notes (Signed)
Subjective:     Patient ID: Doris Boyd, female   DOB: 12/07/1946, 68 y.o.   MRN: [id number]  HPI patient states I'm doing better in my ankle but the back of my heel is continuing to hurt me on the outside and I have had trouble wearing the boot   Review of Systems     Objective:   Physical Exam Neurovascular status intact with muscle strength adequate and noted to have posterior lateral pain around the Achilles tendon with no other indications of acute injury and no pain currently in the sinus tarsi    Assessment:     Doing better sinus tarsitis left but does have lateral Achilles tendinitis left it's inflamed    Plan:     I reviewed condition and discussed the risk of injection of this area. Patient wants to procedure and I did a sterile prep of the lateral side left Achilles and injected with 3 mg dexamethasone Kenalog 5 mg Xylocaine and advised on reduced activity and trying to wear her boot with a elevation of the right foot. Reappoint in 3 weeks

## 2014-09-12 NOTE — Patient Instructions (Signed)

## 2014-10-03 ENCOUNTER — Ambulatory Visit: Payer: BC Managed Care – PPO | Admitting: Podiatry

## 2015-06-28 ENCOUNTER — Encounter: Payer: Self-pay | Admitting: Podiatry

## 2015-06-28 ENCOUNTER — Ambulatory Visit (INDEPENDENT_AMBULATORY_CARE_PROVIDER_SITE_OTHER): Payer: PPO

## 2015-06-28 ENCOUNTER — Ambulatory Visit (INDEPENDENT_AMBULATORY_CARE_PROVIDER_SITE_OTHER): Payer: PPO | Admitting: Podiatry

## 2015-06-28 VITALS — BP 129/72 | HR 78 | Resp 15

## 2015-06-28 DIAGNOSIS — M7662 Achilles tendinitis, left leg: Secondary | ICD-10-CM | POA: Diagnosis not present

## 2015-06-29 NOTE — Progress Notes (Signed)
Subjective:     Patient ID: Doris Boyd, female   DOB: September 23, 1946, 69 y.o.   MRN: [id number]  HPI patient presents with continued posterior pain in the left heel lateral side admitting that she did not have a good recovery and that she is continue to have pain despite immobilization and previous injection treatment   Review of Systems     Objective:   Physical Exam Neurovascular status intact muscle strength adequate with pain in the posterior lateral aspect left heel localized in nature with inflammation and no indications of tendon dysfunction or muscle strength loss    Assessment:     Achilles tendinitis with inflammation of a chronic nature with failure to respond to conservative care so far    Plan:     Advised on shockwave therapy which I believe would be her best option given her problems and patient will make a decision on this and reappoint for this procedure

## 2015-07-19 ENCOUNTER — Other Ambulatory Visit: Payer: Self-pay | Admitting: Internal Medicine

## 2015-07-19 ENCOUNTER — Ambulatory Visit
Admission: RE | Admit: 2015-07-19 | Discharge: 2015-07-19 | Disposition: A | Discharge status: Home / Self Care | Payer: PPO | Source: Ambulatory Visit | Attending: Internal Medicine | Admitting: Internal Medicine

## 2015-07-19 DIAGNOSIS — J45909 Unspecified asthma, uncomplicated: Secondary | ICD-10-CM

## 2015-08-04 ENCOUNTER — Ambulatory Visit (INDEPENDENT_AMBULATORY_CARE_PROVIDER_SITE_OTHER): Payer: PPO | Admitting: Podiatry

## 2015-08-04 ENCOUNTER — Encounter: Payer: Self-pay | Admitting: Podiatry

## 2015-08-04 DIAGNOSIS — M7662 Achilles tendinitis, left leg: Secondary | ICD-10-CM

## 2015-08-06 NOTE — Progress Notes (Signed)
Subjective:     Patient ID: Doris Boyd, female   DOB: [birth year], 69 y.o.   MRN: [id number]  HPI patient presents with pain in the outside and center of the posterior left heel that makes it hard for her to wear shoe gear walk comfortably   Review of Systems     Objective:   Physical Exam Neurovascular status intact with pain in the posterior aspect left heel lateral side and slightly in the central    Assessment:     Achilles tendinitis left    Plan:     Shockwave administered 3000 shocks at 2.8 intensity 17 frequency also did 1000 to the calf muscle to loosen that up

## 2015-08-11 ENCOUNTER — Encounter: Payer: Self-pay | Admitting: Podiatry

## 2015-08-11 ENCOUNTER — Ambulatory Visit (INDEPENDENT_AMBULATORY_CARE_PROVIDER_SITE_OTHER): Payer: PPO | Admitting: Podiatry

## 2015-08-11 DIAGNOSIS — M7662 Achilles tendinitis, left leg: Secondary | ICD-10-CM

## 2015-08-13 NOTE — Progress Notes (Signed)
Subjective:     Patient ID: Doris Boyd, female   DOB: [birth year], 69 y.o.   MRN: [id number]  HPI patient states it may be some better but it still sore   Review of Systems     Objective:   Physical Exam Neurovascular status intact muscle strength adequate range of motion within normal limits with pain in the left posterior heel that's mildly improved but present    Assessment:     Continued Achilles tendinitis left    Plan:     Did shockwave today 3000 shocks 3.6 intensity 17 frequency tolerated well to the lateral heel and reappoint to have this performed again

## 2015-08-18 ENCOUNTER — Ambulatory Visit: Payer: PPO | Admitting: Podiatry

## 2015-09-01 ENCOUNTER — Encounter: Payer: Self-pay | Admitting: Podiatry

## 2015-09-01 ENCOUNTER — Ambulatory Visit (INDEPENDENT_AMBULATORY_CARE_PROVIDER_SITE_OTHER): Payer: PPO | Admitting: Podiatry

## 2015-09-01 DIAGNOSIS — M7662 Achilles tendinitis, left leg: Secondary | ICD-10-CM

## 2015-09-04 NOTE — Progress Notes (Signed)
Subjective:     Patient ID: Doris Boyd, female   DOB: 1946/04/17, 69 y.o.   MRN: [id number]  HPI patient presents stating my heel is somewhat better   Review of Systems     Objective:   Physical Exam Neurovascular status intact with discomfort posterior heel right which is present but slightly improved from previously    Assessment:     Tendinitis which is improving Achilles right    Plan:     Reapplied shockwave 3000 shocks at 3.8 intensity 17 frequency tolerated well and reappoint to recheck

## 2015-09-29 ENCOUNTER — Ambulatory Visit (INDEPENDENT_AMBULATORY_CARE_PROVIDER_SITE_OTHER): Payer: PPO | Admitting: Podiatry

## 2015-09-29 ENCOUNTER — Encounter: Payer: Self-pay | Admitting: Podiatry

## 2015-09-29 ENCOUNTER — Telehealth: Payer: Self-pay | Admitting: *Deleted

## 2015-09-29 DIAGNOSIS — M7662 Achilles tendinitis, left leg: Secondary | ICD-10-CM

## 2015-09-29 MED ORDER — NONFORMULARY OR COMPOUNDED ITEM: Status: DC

## 2015-09-29 NOTE — Telephone Encounter (Signed)
Dr. Charlsie Merles ordered Antiinflammatory Cream: Diclofenac 3%, Baclofen 2%, Cyclobenzaprine 2%, Lidocaine 2% dispense 120gram apply 1-2 grams to affected area 3-4 times daily, +3 refills. faxed

## 2015-09-30 NOTE — Progress Notes (Signed)
Subjective:     Patient ID: Doris Boyd, female   DOB: 08-28-1946, 69 y.o.   MRN: [id number]  HPI patient states she's doing quite a bit better with her heel   Review of Systems     Objective:   Physical Exam Neurovascular status intact with diminished discomfort when I palpated the posterior medial heel    Assessment:     Improving Achilles tendinitis but still painful when pressed    Plan:     Shockwave administered 4.2 intensity 3000 shocks 17 frequency and I did send a prescription for a topical medication to try to reduce the inflammatory component

## 2015-10-02 ENCOUNTER — Telehealth: Payer: Self-pay | Admitting: *Deleted

## 2015-10-03 NOTE — Telephone Encounter (Signed)
entered in error   

## 2016-01-22 DIAGNOSIS — J45909 Unspecified asthma, uncomplicated: Secondary | ICD-10-CM | POA: Diagnosis not present

## 2016-01-22 DIAGNOSIS — J309 Allergic rhinitis, unspecified: Secondary | ICD-10-CM | POA: Diagnosis not present

## 2016-02-02 DIAGNOSIS — J310 Chronic rhinitis: Secondary | ICD-10-CM | POA: Diagnosis not present

## 2016-02-02 DIAGNOSIS — R05 Cough: Secondary | ICD-10-CM | POA: Diagnosis not present

## 2016-02-02 DIAGNOSIS — K219 Gastro-esophageal reflux disease without esophagitis: Secondary | ICD-10-CM | POA: Diagnosis not present

## 2016-02-05 ENCOUNTER — Other Ambulatory Visit: Payer: Self-pay | Admitting: Allergy and Immunology

## 2016-02-05 ENCOUNTER — Ambulatory Visit
Admission: RE | Admit: 2016-02-05 | Discharge: 2016-02-05 | Disposition: A | Discharge status: Home / Self Care | Payer: PPO | Source: Ambulatory Visit | Attending: Allergy and Immunology | Admitting: Allergy and Immunology

## 2016-02-05 DIAGNOSIS — R05 Cough: Secondary | ICD-10-CM | POA: Diagnosis not present

## 2016-02-05 DIAGNOSIS — R059 Cough, unspecified: Secondary | ICD-10-CM

## 2016-02-09 DIAGNOSIS — J45909 Unspecified asthma, uncomplicated: Secondary | ICD-10-CM | POA: Diagnosis not present

## 2016-02-09 DIAGNOSIS — Z0001 Encounter for general adult medical examination with abnormal findings: Secondary | ICD-10-CM | POA: Diagnosis not present

## 2016-02-09 DIAGNOSIS — R739 Hyperglycemia, unspecified: Secondary | ICD-10-CM | POA: Diagnosis not present

## 2016-02-09 DIAGNOSIS — H6120 Impacted cerumen, unspecified ear: Secondary | ICD-10-CM | POA: Diagnosis not present

## 2016-02-09 DIAGNOSIS — Z6834 Body mass index (BMI) 34.0-34.9, adult: Secondary | ICD-10-CM | POA: Diagnosis not present

## 2016-02-09 DIAGNOSIS — E559 Vitamin D deficiency, unspecified: Secondary | ICD-10-CM | POA: Diagnosis not present

## 2016-02-09 DIAGNOSIS — M7660 Achilles tendinitis, unspecified leg: Secondary | ICD-10-CM | POA: Diagnosis not present

## 2016-02-09 DIAGNOSIS — H6121 Impacted cerumen, right ear: Secondary | ICD-10-CM | POA: Diagnosis not present

## 2016-02-09 DIAGNOSIS — E782 Mixed hyperlipidemia: Secondary | ICD-10-CM | POA: Diagnosis not present

## 2016-02-09 DIAGNOSIS — Z1389 Encounter for screening for other disorder: Secondary | ICD-10-CM | POA: Diagnosis not present

## 2016-02-09 DIAGNOSIS — J309 Allergic rhinitis, unspecified: Secondary | ICD-10-CM | POA: Diagnosis not present

## 2016-02-09 DIAGNOSIS — Z79899 Other long term (current) drug therapy: Secondary | ICD-10-CM | POA: Diagnosis not present

## 2016-07-17 DIAGNOSIS — B349 Viral infection, unspecified: Secondary | ICD-10-CM | POA: Diagnosis not present

## 2016-07-17 DIAGNOSIS — J029 Acute pharyngitis, unspecified: Secondary | ICD-10-CM | POA: Diagnosis not present

## 2016-10-02 DIAGNOSIS — H31103 Choroidal degeneration, unspecified, bilateral: Secondary | ICD-10-CM | POA: Diagnosis not present

## 2016-10-02 DIAGNOSIS — I708 Atherosclerosis of other arteries: Secondary | ICD-10-CM | POA: Diagnosis not present

## 2016-10-02 DIAGNOSIS — H2513 Age-related nuclear cataract, bilateral: Secondary | ICD-10-CM | POA: Diagnosis not present

## 2016-10-02 DIAGNOSIS — H25013 Cortical age-related cataract, bilateral: Secondary | ICD-10-CM | POA: Diagnosis not present

## 2016-10-02 DIAGNOSIS — H35363 Drusen (degenerative) of macula, bilateral: Secondary | ICD-10-CM | POA: Diagnosis not present

## 2017-01-21 DIAGNOSIS — J45909 Unspecified asthma, uncomplicated: Secondary | ICD-10-CM | POA: Diagnosis not present

## 2017-01-21 DIAGNOSIS — S86912A Strain of unspecified muscle(s) and tendon(s) at lower leg level, left leg, initial encounter: Secondary | ICD-10-CM | POA: Diagnosis not present

## 2017-02-26 DIAGNOSIS — M7542 Impingement syndrome of left shoulder: Secondary | ICD-10-CM | POA: Diagnosis not present

## 2017-02-28 ENCOUNTER — Other Ambulatory Visit: Payer: Self-pay | Admitting: Internal Medicine

## 2017-02-28 DIAGNOSIS — J309 Allergic rhinitis, unspecified: Secondary | ICD-10-CM | POA: Diagnosis not present

## 2017-02-28 DIAGNOSIS — Z6834 Body mass index (BMI) 34.0-34.9, adult: Secondary | ICD-10-CM | POA: Diagnosis not present

## 2017-02-28 DIAGNOSIS — Z1211 Encounter for screening for malignant neoplasm of colon: Secondary | ICD-10-CM | POA: Diagnosis not present

## 2017-02-28 DIAGNOSIS — Z79899 Other long term (current) drug therapy: Secondary | ICD-10-CM | POA: Diagnosis not present

## 2017-02-28 DIAGNOSIS — Z1231 Encounter for screening mammogram for malignant neoplasm of breast: Secondary | ICD-10-CM

## 2017-02-28 DIAGNOSIS — Z0000 Encounter for general adult medical examination without abnormal findings: Secondary | ICD-10-CM | POA: Diagnosis not present

## 2017-02-28 DIAGNOSIS — K219 Gastro-esophageal reflux disease without esophagitis: Secondary | ICD-10-CM | POA: Diagnosis not present

## 2017-02-28 DIAGNOSIS — R7309 Other abnormal glucose: Secondary | ICD-10-CM | POA: Diagnosis not present

## 2017-02-28 DIAGNOSIS — J4520 Mild intermittent asthma, uncomplicated: Secondary | ICD-10-CM | POA: Diagnosis not present

## 2017-02-28 DIAGNOSIS — Z1389 Encounter for screening for other disorder: Secondary | ICD-10-CM | POA: Diagnosis not present

## 2017-02-28 DIAGNOSIS — E782 Mixed hyperlipidemia: Secondary | ICD-10-CM | POA: Diagnosis not present

## 2017-02-28 DIAGNOSIS — E669 Obesity, unspecified: Secondary | ICD-10-CM | POA: Diagnosis not present

## 2017-02-28 DIAGNOSIS — E559 Vitamin D deficiency, unspecified: Secondary | ICD-10-CM | POA: Diagnosis not present

## 2017-03-19 ENCOUNTER — Ambulatory Visit
Admission: RE | Admit: 2017-03-19 | Discharge: 2017-03-19 | Disposition: A | Discharge status: Home / Self Care | Payer: PPO | Source: Ambulatory Visit | Attending: Internal Medicine | Admitting: Internal Medicine

## 2017-03-19 DIAGNOSIS — Z1231 Encounter for screening mammogram for malignant neoplasm of breast: Secondary | ICD-10-CM | POA: Diagnosis not present

## 2017-04-10 ENCOUNTER — Other Ambulatory Visit: Payer: Self-pay | Admitting: Gastroenterology

## 2017-06-13 ENCOUNTER — Encounter (HOSPITAL_COMMUNITY): Payer: Self-pay | Admitting: *Deleted

## 2017-06-17 ENCOUNTER — Ambulatory Visit (HOSPITAL_COMMUNITY): Payer: PPO | Admitting: Anesthesiology

## 2017-06-17 ENCOUNTER — Encounter (HOSPITAL_COMMUNITY): Payer: Self-pay | Admitting: *Deleted

## 2017-06-17 ENCOUNTER — Encounter (HOSPITAL_COMMUNITY)
Admission: RE | Disposition: A | Discharge status: Home / Self Care | Payer: Self-pay | Source: Ambulatory Visit | Attending: Gastroenterology

## 2017-06-17 ENCOUNTER — Ambulatory Visit (HOSPITAL_COMMUNITY)
Admission: RE | Admit: 2017-06-17 | Discharge: 2017-06-17 | Disposition: A | Discharge status: Home / Self Care | Payer: PPO | Source: Ambulatory Visit | Attending: Gastroenterology | Admitting: Gastroenterology

## 2017-06-17 DIAGNOSIS — Z880 Allergy status to penicillin: Secondary | ICD-10-CM | POA: Insufficient documentation

## 2017-06-17 DIAGNOSIS — Z881 Allergy status to other antibiotic agents status: Secondary | ICD-10-CM | POA: Diagnosis not present

## 2017-06-17 DIAGNOSIS — Z87891 Personal history of nicotine dependence: Secondary | ICD-10-CM | POA: Diagnosis not present

## 2017-06-17 DIAGNOSIS — K219 Gastro-esophageal reflux disease without esophagitis: Secondary | ICD-10-CM | POA: Diagnosis not present

## 2017-06-17 DIAGNOSIS — Z1211 Encounter for screening for malignant neoplasm of colon: Secondary | ICD-10-CM | POA: Diagnosis not present

## 2017-06-17 DIAGNOSIS — Z7951 Long term (current) use of inhaled steroids: Secondary | ICD-10-CM | POA: Diagnosis not present

## 2017-06-17 DIAGNOSIS — E7800 Pure hypercholesterolemia, unspecified: Secondary | ICD-10-CM | POA: Insufficient documentation

## 2017-06-17 DIAGNOSIS — Z79899 Other long term (current) drug therapy: Secondary | ICD-10-CM | POA: Insufficient documentation

## 2017-06-17 DIAGNOSIS — E785 Hyperlipidemia, unspecified: Secondary | ICD-10-CM | POA: Diagnosis not present

## 2017-06-17 DIAGNOSIS — K5730 Diverticulosis of large intestine without perforation or abscess without bleeding: Secondary | ICD-10-CM | POA: Diagnosis not present

## 2017-06-17 HISTORY — PX: COLONOSCOPY WITH PROPOFOL: SHX5780

## 2017-06-17 HISTORY — DX: Family history of other specified conditions: Z84.89

## 2017-06-17 HISTORY — DX: Anemia, unspecified: D64.9

## 2017-06-17 HISTORY — DX: Unspecified asthma, uncomplicated: J45.909

## 2017-06-17 SURGERY — COLONOSCOPY WITH PROPOFOL
Anesthesia: Monitor Anesthesia Care

## 2017-06-17 MED ORDER — SODIUM CHLORIDE 0.9 % IV SOLN: INTRAVENOUS | Status: DC

## 2017-06-17 MED ORDER — PROPOFOL 10 MG/ML IV BOLUS
INTRAVENOUS | Status: DC | PRN
  Administered 2017-06-17 (×4): 20 mg via INTRAVENOUS

## 2017-06-17 MED ORDER — PROPOFOL 500 MG/50ML IV EMUL
INTRAVENOUS | Status: DC | PRN
  Administered 2017-06-17: 140 ug/kg/min via INTRAVENOUS

## 2017-06-17 MED ORDER — LIDOCAINE 2% (20 MG/ML) 5 ML SYRINGE: INTRAMUSCULAR | Status: DC | PRN

## 2017-06-17 MED ORDER — LACTATED RINGERS IV SOLN
INTRAVENOUS | Status: DC
  Administered 2017-06-17: 12:00:00 via INTRAVENOUS

## 2017-06-17 MED ORDER — PROPOFOL 10 MG/ML IV BOLUS
INTRAVENOUS | Status: AC
  Filled 2017-06-17: qty 40

## 2017-06-17 SURGICAL SUPPLY — 22 items

## 2017-06-17 NOTE — Anesthesia Preprocedure Evaluation (Signed)
Anesthesia Evaluation  Patient identified by MRN, date of birth, ID band Patient awake    Reviewed: Allergy & Precautions, H&P , NPO status , Patient's Chart, lab work & pertinent test results  Airway Mallampati: I  TM Distance: >3 FB Neck ROM: Full    Dental no notable dental hx. (+) Teeth Intact   Pulmonary former smoker,    Pulmonary exam normal breath sounds clear to auscultation       Cardiovascular negative cardio ROS Normal cardiovascular exam Rhythm:Regular Rate:Normal     Neuro/Psych negative neurological ROS  negative psych ROS   GI/Hepatic Neg liver ROS, GERD  ,  Endo/Other  negative endocrine ROS  Renal/GU negative Renal ROS  negative genitourinary   Musculoskeletal negative musculoskeletal ROS (+)   Abdominal Normal abdominal exam  (+)   Peds  Hematology  (+) Blood dyscrasia, anemia ,   Anesthesia Other Findings   Reproductive/Obstetrics negative OB ROS                             Anesthesia Physical  Anesthesia Plan  ASA: II  Anesthesia Plan: MAC   Post-op Pain Management:    Induction:   PONV Risk Score and Plan: 2 and Ondansetron, Dexamethasone and Treatment may vary due to age or medical condition  Airway Management Planned: Simple Face Mask and Natural Airway  Additional Equipment:   Intra-op Plan:   Post-operative Plan:   Informed Consent: I have reviewed the patients History and Physical, chart, labs and discussed the procedure including the risks, benefits and alternatives for the proposed anesthesia with the patient or authorized representative who has indicated his/her understanding and acceptance.     Plan Discussed with: CRNA and Surgeon  Anesthesia Plan Comments:         Anesthesia Quick Evaluation

## 2017-06-17 NOTE — Transfer of Care (Signed)
Immediate Anesthesia Transfer of Care Note  Patient: Doris Boyd  Procedure(s) Performed: Procedure(s): COLONOSCOPY WITH PROPOFOL (N/A)  Patient Location: Endoscopy Unit  Anesthesia Type:MAC  Level of Consciousness: awake and alert   Airway & Oxygen Therapy: Patient Spontanous Breathing and Patient connected to face mask oxygen  Post-op Assessment: Report given to RN and Post -op Vital signs reviewed and stable  Post vital signs: Reviewed and stable  Last Vitals:  Vitals:   06/17/17 1130  BP: (!) 164/61  Pulse: 65  Resp: 14  Temp: 36.4 C    Last Pain:  Vitals:   06/17/17 1130  TempSrc: Oral         Complications: No apparent anesthesia complications

## 2017-06-17 NOTE — H&P (Signed)
Procedure: Screening colonoscopy. Normal screening colonoscopy was performed on June 14, 1946  History: The patient is a 71 year old female born June 14, 1946. She is scheduled to undergo a repeat screening colonoscopy today.  Past medical history: Hypercholesterolemia. Gastroesophageal reflux. Allergic rhinitis. Hysterectomy.  Medication allergies: Septra and penicillin  Exam: The patient is alert and lying comfortably on the endoscopy stretcher. Abdomen is soft and nontender to palpation. Lungs are clear to auscultation. Cardiac exam reveals a regular rhythm.  Plan: Proceed with screening colonoscopy

## 2017-06-17 NOTE — Op Note (Signed)
[facility] Patient Name: Doris ChestnutRuby Boyd Procedure Date: 06/17/2017 MRN: [id number] Attending MD: Charolett BumpersMartin K Saxe , MD Date of Birth: April 18, 1946 CSN: [id number] Age: [age] Admit Type: Outpatient Procedure:                Colonoscopy Indications:              Screening for colorectal malignant neoplasm. Normal                            screening colonoscopy was performed on 05/03/2005. Providers:                Charolett BumpersMartin K. Creed, MD, Omelia BlackwaterShelby Carpenter RN, RN,                            Beryle BeamsJanie Billups, Technician, Leroy Libmaniana Reardon, CRNA Referring MD:              Medicines:                Propofol per Anesthesia Complications:            No immediate complications. Estimated Blood Loss:     Estimated blood loss: none. Estimated blood loss:                            none. Procedure:                Pre-Anesthesia Assessment:                           - Prior to the procedure, a History and Physical                            was performed, and patient medications and                            allergies were reviewed. The patient's tolerance of                            previous anesthesia was also reviewed. The risks                            and benefits of the procedure and the sedation                            options and risks were discussed with the patient.                            All questions were answered, and informed consent                            was obtained. Prior Anticoagulants: The patient has                            taken no previous anticoagulant or antiplatelet  agents. ASA Grade Assessment: II - A patient with                            mild systemic disease. After reviewing the risks                            and benefits, the patient was deemed in                            satisfactory condition to undergo the procedure.                           After obtaining informed consent, the colonoscope          was passed under direct vision. Throughout the                            procedure, the patient's blood pressure, pulse, and                            oxygen saturations were monitored continuously. The                            EC-[id number] ([id number]) scope was introduced through                            the anus and advanced to the the cecum, identified                            by appendiceal orifice and ileocecal valve. The                            colonoscopy was somewhat difficult due to                            significant looping. The patient tolerated the                            procedure well. The quality of the bowel                            preparation was good. The terminal ileum, the                            ileocecal valve, the appendiceal orifice and the                            rectum were photographed. Scope In: 12:48:12 PM Scope Out: 1:09:04 PM Scope Withdrawal Time: 0 hours 10 minutes 24 seconds  Total Procedure Duration: 0 hours 20 minutes 52 seconds  Findings:      The perianal and digital rectal examinations were normal.      The entire examined colon appeared normal. Universal coloonic       diverticulosis was present. Impression:               -  The entire examined colon is normal.                           - No specimens collected. Moderate Sedation:      N/A- Per Anesthesia Care Recommendation:           - Patient has a contact number available for                            emergencies. The signs and symptoms of potential                            delayed complications were discussed with the                            patient. Return to normal activities tomorrow.                            Written discharge instructions were provided to the                            patient.                           - Repeat colonoscopy is not recommended for                            screening purposes.                           - Resume  previous diet.                           - Continue present medications. Procedure Code(s):        --- Professional ---                           Z6109, Colorectal cancer screening; colonoscopy on                            individual not meeting criteria for high risk Diagnosis Code(s):        --- Professional ---                           Z12.11, Encounter for screening for malignant                            neoplasm of colon CPT copyright 2016 [employer]. All rights reserved. The codes documented in this report are preliminary and upon coder review may  be revised to meet current compliance requirements. Danise Edge, MD Charolett Bumpers, MD 06/17/2017 1:21:37 PM This report has been signed electronically. Number of Addenda: 0

## 2017-06-17 NOTE — Anesthesia Postprocedure Evaluation (Signed)
Anesthesia Post Note  Patient: Doris Boyd  Procedure(s) Performed: Procedure(s) (LRB): COLONOSCOPY WITH PROPOFOL (N/A)     Patient location during evaluation: PACU Anesthesia Type: MAC Level of consciousness: awake Pain management: pain level controlled Vital Signs Assessment: post-procedure vital signs reviewed and stable Respiratory status: spontaneous breathing Cardiovascular status: stable Postop Assessment: no signs of nausea or vomiting Anesthetic complications: no    Last Vitals:  Vitals:   06/17/17 1130  BP: (!) 164/61  Pulse: 65  Resp: 14  Temp: 36.4 C    Last Pain:  Vitals:   06/17/17 1130  TempSrc: Oral   Pain Goal:                 [name] JR,JOHN Jnya Brossard

## 2017-06-17 NOTE — Discharge Instructions (Signed)
Colonoscopy, Adult, Care After  This sheet gives you information about how to care for yourself after your procedure. Your health care provider may also give you more specific instructions. If you have problems or questions, contact your health care provider.  What can I expect after the procedure?  After the procedure, it is common to have:  · A small amount of blood in your stool for 24 hours after the procedure.  · Some gas.  · Mild abdominal cramping or bloating.    Follow these instructions at home:  General instructions    · For the first 24 hours after the procedure:  ? Do not drive or use machinery.  ? Do not sign important documents.  ? Do not drink alcohol.  ? Do your regular daily activities at a slower pace than normal.  ? Eat soft, easy-to-digest foods.  ? Rest often.  · Take over-the-counter or prescription medicines only as told by your health care provider.  · It is up to you to get the results of your procedure. Ask your health care provider, or the department performing the procedure, when your results will be ready.  Relieving cramping and bloating  · Try walking around when you have cramps or feel bloated.  · Apply heat to your abdomen as told by your health care provider. Use a heat source that your health care provider recommends, such as a moist heat pack or a heating pad.  ? Place a towel between your skin and the heat source.  ? Leave the heat on for 20-30 minutes.  ? Remove the heat if your skin turns bright red. This is especially important if you are unable to feel pain, heat, or cold. You may have a greater risk of getting burned.  Eating and drinking  · Drink enough fluid to keep your urine clear or pale yellow.  · Resume your normal diet as instructed by your health care provider. Avoid heavy or fried foods that are hard to digest.  · Avoid drinking alcohol for as long as instructed by your health care provider.  Contact a health care provider if:  · You have blood in your stool 2-3  days after the procedure.  Get help right away if:  · You have more than a small spotting of blood in your stool.  · You pass large blood clots in your stool.  · Your abdomen is swollen.  · You have nausea or vomiting.  · You have a fever.  · You have increasing abdominal pain that is not relieved with medicine.  This information is not intended to replace advice given to you by your health care provider. Make sure you discuss any questions you have with your health care provider.  Document Released: 07/23/2004 Document Revised: 09/02/2016 Document Reviewed: 02/20/2016  [employer] Interactive Patient Education © 2018 [employer]

## 2017-06-20 ENCOUNTER — Encounter (HOSPITAL_COMMUNITY): Payer: Self-pay | Admitting: Gastroenterology

## 2017-07-16 DIAGNOSIS — R350 Frequency of micturition: Secondary | ICD-10-CM | POA: Diagnosis not present

## 2017-08-13 DIAGNOSIS — M12812 Other specific arthropathies, not elsewhere classified, left shoulder: Secondary | ICD-10-CM | POA: Diagnosis not present

## 2017-09-23 DIAGNOSIS — J069 Acute upper respiratory infection, unspecified: Secondary | ICD-10-CM | POA: Diagnosis not present

## 2018-02-12 ENCOUNTER — Other Ambulatory Visit: Payer: Self-pay | Admitting: Internal Medicine

## 2018-02-12 DIAGNOSIS — Z1231 Encounter for screening mammogram for malignant neoplasm of breast: Secondary | ICD-10-CM

## 2018-03-10 DIAGNOSIS — E559 Vitamin D deficiency, unspecified: Secondary | ICD-10-CM | POA: Diagnosis not present

## 2018-03-10 DIAGNOSIS — M12812 Other specific arthropathies, not elsewhere classified, left shoulder: Secondary | ICD-10-CM | POA: Diagnosis not present

## 2018-03-10 DIAGNOSIS — R7303 Prediabetes: Secondary | ICD-10-CM | POA: Diagnosis not present

## 2018-03-10 DIAGNOSIS — E782 Mixed hyperlipidemia: Secondary | ICD-10-CM | POA: Diagnosis not present

## 2018-03-10 DIAGNOSIS — H269 Unspecified cataract: Secondary | ICD-10-CM | POA: Diagnosis not present

## 2018-03-10 DIAGNOSIS — Z23 Encounter for immunization: Secondary | ICD-10-CM | POA: Diagnosis not present

## 2018-03-10 DIAGNOSIS — J45909 Unspecified asthma, uncomplicated: Secondary | ICD-10-CM | POA: Diagnosis not present

## 2018-03-10 DIAGNOSIS — Z79899 Other long term (current) drug therapy: Secondary | ICD-10-CM | POA: Diagnosis not present

## 2018-03-10 DIAGNOSIS — J309 Allergic rhinitis, unspecified: Secondary | ICD-10-CM | POA: Diagnosis not present

## 2018-03-10 DIAGNOSIS — Z8249 Family history of ischemic heart disease and other diseases of the circulatory system: Secondary | ICD-10-CM | POA: Diagnosis not present

## 2018-03-10 DIAGNOSIS — K219 Gastro-esophageal reflux disease without esophagitis: Secondary | ICD-10-CM | POA: Diagnosis not present

## 2018-03-23 ENCOUNTER — Ambulatory Visit
Admission: RE | Admit: 2018-03-23 | Discharge: 2018-03-23 | Disposition: A | Discharge status: Home / Self Care | Payer: PPO | Source: Ambulatory Visit | Attending: Internal Medicine | Admitting: Internal Medicine

## 2018-03-23 DIAGNOSIS — Z1231 Encounter for screening mammogram for malignant neoplasm of breast: Secondary | ICD-10-CM

## 2018-04-15 DIAGNOSIS — R05 Cough: Secondary | ICD-10-CM | POA: Diagnosis not present

## 2018-04-29 DIAGNOSIS — M8588 Other specified disorders of bone density and structure, other site: Secondary | ICD-10-CM | POA: Diagnosis not present

## 2018-06-15 DIAGNOSIS — R350 Frequency of micturition: Secondary | ICD-10-CM | POA: Diagnosis not present

## 2018-07-27 DIAGNOSIS — H2513 Age-related nuclear cataract, bilateral: Secondary | ICD-10-CM | POA: Diagnosis not present

## 2018-07-27 DIAGNOSIS — H35363 Drusen (degenerative) of macula, bilateral: Secondary | ICD-10-CM | POA: Diagnosis not present

## 2018-07-27 DIAGNOSIS — H25013 Cortical age-related cataract, bilateral: Secondary | ICD-10-CM | POA: Diagnosis not present

## 2018-07-27 DIAGNOSIS — I708 Atherosclerosis of other arteries: Secondary | ICD-10-CM | POA: Diagnosis not present

## 2018-07-27 DIAGNOSIS — H2512 Age-related nuclear cataract, left eye: Secondary | ICD-10-CM | POA: Diagnosis not present

## 2018-08-11 DIAGNOSIS — H2512 Age-related nuclear cataract, left eye: Secondary | ICD-10-CM | POA: Diagnosis not present

## 2018-08-11 DIAGNOSIS — H25812 Combined forms of age-related cataract, left eye: Secondary | ICD-10-CM | POA: Diagnosis not present

## 2018-09-02 DIAGNOSIS — H25011 Cortical age-related cataract, right eye: Secondary | ICD-10-CM | POA: Diagnosis not present

## 2018-09-02 DIAGNOSIS — H2511 Age-related nuclear cataract, right eye: Secondary | ICD-10-CM | POA: Diagnosis not present

## 2018-09-08 DIAGNOSIS — H2511 Age-related nuclear cataract, right eye: Secondary | ICD-10-CM | POA: Diagnosis not present

## 2018-09-08 DIAGNOSIS — H25811 Combined forms of age-related cataract, right eye: Secondary | ICD-10-CM | POA: Diagnosis not present

## 2018-10-23 DIAGNOSIS — B370 Candidal stomatitis: Secondary | ICD-10-CM | POA: Diagnosis not present

## 2019-03-26 DIAGNOSIS — Z87891 Personal history of nicotine dependence: Secondary | ICD-10-CM | POA: Diagnosis not present

## 2019-03-26 DIAGNOSIS — Z720 Tobacco use: Secondary | ICD-10-CM | POA: Diagnosis not present

## 2019-03-26 DIAGNOSIS — J449 Chronic obstructive pulmonary disease, unspecified: Secondary | ICD-10-CM | POA: Diagnosis not present

## 2019-03-26 DIAGNOSIS — R7303 Prediabetes: Secondary | ICD-10-CM | POA: Diagnosis not present

## 2019-03-26 DIAGNOSIS — E559 Vitamin D deficiency, unspecified: Secondary | ICD-10-CM | POA: Diagnosis not present

## 2019-03-26 DIAGNOSIS — E782 Mixed hyperlipidemia: Secondary | ICD-10-CM | POA: Diagnosis not present

## 2019-04-06 DIAGNOSIS — E2839 Other primary ovarian failure: Secondary | ICD-10-CM | POA: Diagnosis not present

## 2019-04-06 DIAGNOSIS — I10 Essential (primary) hypertension: Secondary | ICD-10-CM | POA: Diagnosis not present

## 2019-04-06 DIAGNOSIS — H35013 Changes in retinal vascular appearance, bilateral: Secondary | ICD-10-CM | POA: Diagnosis not present

## 2019-04-06 DIAGNOSIS — Z961 Presence of intraocular lens: Secondary | ICD-10-CM | POA: Diagnosis not present

## 2019-04-06 DIAGNOSIS — E785 Hyperlipidemia, unspecified: Secondary | ICD-10-CM | POA: Diagnosis not present

## 2019-04-06 DIAGNOSIS — Z0000 Encounter for general adult medical examination without abnormal findings: Secondary | ICD-10-CM | POA: Diagnosis not present

## 2019-04-06 DIAGNOSIS — Z23 Encounter for immunization: Secondary | ICD-10-CM | POA: Diagnosis not present

## 2019-04-06 DIAGNOSIS — H35363 Drusen (degenerative) of macula, bilateral: Secondary | ICD-10-CM | POA: Diagnosis not present

## 2019-04-06 DIAGNOSIS — H35033 Hypertensive retinopathy, bilateral: Secondary | ICD-10-CM | POA: Diagnosis not present

## 2019-04-06 DIAGNOSIS — M8949 Other hypertrophic osteoarthropathy, multiple sites: Secondary | ICD-10-CM | POA: Diagnosis not present

## 2019-05-18 ENCOUNTER — Other Ambulatory Visit: Payer: Self-pay | Admitting: Internal Medicine

## 2019-05-18 DIAGNOSIS — Z1231 Encounter for screening mammogram for malignant neoplasm of breast: Secondary | ICD-10-CM

## 2019-07-02 ENCOUNTER — Ambulatory Visit: Payer: PRIVATE HEALTH INSURANCE

## 2019-08-17 ENCOUNTER — Ambulatory Visit
Admission: RE | Admit: 2019-08-17 | Discharge: 2019-08-17 | Disposition: A | Discharge status: Home / Self Care | Payer: BC Managed Care – PPO | Source: Ambulatory Visit | Attending: Internal Medicine | Admitting: Internal Medicine

## 2019-08-17 ENCOUNTER — Other Ambulatory Visit: Payer: Self-pay

## 2019-08-17 DIAGNOSIS — Z1231 Encounter for screening mammogram for malignant neoplasm of breast: Secondary | ICD-10-CM

## 2019-09-30 DIAGNOSIS — E559 Vitamin D deficiency, unspecified: Secondary | ICD-10-CM | POA: Diagnosis not present

## 2019-09-30 DIAGNOSIS — Z1389 Encounter for screening for other disorder: Secondary | ICD-10-CM | POA: Diagnosis not present

## 2019-09-30 DIAGNOSIS — E782 Mixed hyperlipidemia: Secondary | ICD-10-CM | POA: Diagnosis not present

## 2019-09-30 DIAGNOSIS — J4520 Mild intermittent asthma, uncomplicated: Secondary | ICD-10-CM | POA: Diagnosis not present

## 2019-09-30 DIAGNOSIS — R7309 Other abnormal glucose: Secondary | ICD-10-CM | POA: Diagnosis not present

## 2019-09-30 DIAGNOSIS — R413 Other amnesia: Secondary | ICD-10-CM | POA: Diagnosis not present

## 2019-09-30 DIAGNOSIS — Z79899 Other long term (current) drug therapy: Secondary | ICD-10-CM | POA: Diagnosis not present

## 2019-09-30 DIAGNOSIS — E669 Obesity, unspecified: Secondary | ICD-10-CM | POA: Diagnosis not present

## 2019-09-30 DIAGNOSIS — Z0000 Encounter for general adult medical examination without abnormal findings: Secondary | ICD-10-CM | POA: Diagnosis not present

## 2019-12-08 DIAGNOSIS — R413 Other amnesia: Secondary | ICD-10-CM | POA: Diagnosis not present

## 2019-12-08 DIAGNOSIS — R7303 Prediabetes: Secondary | ICD-10-CM | POA: Diagnosis not present

## 2019-12-08 DIAGNOSIS — M19042 Primary osteoarthritis, left hand: Secondary | ICD-10-CM | POA: Diagnosis not present

## 2019-12-08 DIAGNOSIS — K625 Hemorrhage of anus and rectum: Secondary | ICD-10-CM | POA: Diagnosis not present

## 2019-12-08 DIAGNOSIS — E782 Mixed hyperlipidemia: Secondary | ICD-10-CM | POA: Diagnosis not present

## 2020-04-11 DIAGNOSIS — H26492 Other secondary cataract, left eye: Secondary | ICD-10-CM | POA: Diagnosis not present

## 2020-04-11 DIAGNOSIS — H35033 Hypertensive retinopathy, bilateral: Secondary | ICD-10-CM | POA: Diagnosis not present

## 2020-04-11 DIAGNOSIS — H524 Presbyopia: Secondary | ICD-10-CM | POA: Diagnosis not present

## 2020-04-11 DIAGNOSIS — H35363 Drusen (degenerative) of macula, bilateral: Secondary | ICD-10-CM | POA: Diagnosis not present

## 2020-04-11 DIAGNOSIS — H35013 Changes in retinal vascular appearance, bilateral: Secondary | ICD-10-CM | POA: Diagnosis not present

## 2020-10-31 DIAGNOSIS — Z0000 Encounter for general adult medical examination without abnormal findings: Secondary | ICD-10-CM | POA: Diagnosis not present

## 2020-10-31 DIAGNOSIS — R7309 Other abnormal glucose: Secondary | ICD-10-CM | POA: Diagnosis not present

## 2020-10-31 DIAGNOSIS — Z1389 Encounter for screening for other disorder: Secondary | ICD-10-CM | POA: Diagnosis not present

## 2020-10-31 DIAGNOSIS — R413 Other amnesia: Secondary | ICD-10-CM | POA: Diagnosis not present

## 2020-10-31 DIAGNOSIS — Z7189 Other specified counseling: Secondary | ICD-10-CM | POA: Diagnosis not present

## 2020-10-31 DIAGNOSIS — J4520 Mild intermittent asthma, uncomplicated: Secondary | ICD-10-CM | POA: Diagnosis not present

## 2020-10-31 DIAGNOSIS — M19042 Primary osteoarthritis, left hand: Secondary | ICD-10-CM | POA: Diagnosis not present

## 2020-10-31 DIAGNOSIS — E669 Obesity, unspecified: Secondary | ICD-10-CM | POA: Diagnosis not present

## 2020-10-31 DIAGNOSIS — E782 Mixed hyperlipidemia: Secondary | ICD-10-CM | POA: Diagnosis not present

## 2020-10-31 DIAGNOSIS — E559 Vitamin D deficiency, unspecified: Secondary | ICD-10-CM | POA: Diagnosis not present

## 2020-10-31 DIAGNOSIS — Z79899 Other long term (current) drug therapy: Secondary | ICD-10-CM | POA: Diagnosis not present

## 2020-12-07 ENCOUNTER — Other Ambulatory Visit: Payer: Self-pay | Admitting: Internal Medicine

## 2020-12-07 DIAGNOSIS — Z1231 Encounter for screening mammogram for malignant neoplasm of breast: Secondary | ICD-10-CM

## 2021-01-18 ENCOUNTER — Other Ambulatory Visit: Payer: Self-pay

## 2021-01-18 ENCOUNTER — Ambulatory Visit
Admission: RE | Admit: 2021-01-18 | Discharge: 2021-01-18 | Disposition: A | Discharge status: Home / Self Care | Payer: BC Managed Care – PPO | Source: Ambulatory Visit | Attending: Internal Medicine | Admitting: Internal Medicine

## 2021-01-18 DIAGNOSIS — Z1231 Encounter for screening mammogram for malignant neoplasm of breast: Secondary | ICD-10-CM

## 2021-01-31 DIAGNOSIS — R300 Dysuria: Secondary | ICD-10-CM | POA: Diagnosis not present

## 2021-02-26 DIAGNOSIS — R300 Dysuria: Secondary | ICD-10-CM | POA: Diagnosis not present

## 2021-02-26 DIAGNOSIS — N3001 Acute cystitis with hematuria: Secondary | ICD-10-CM | POA: Diagnosis not present

## 2021-03-19 DIAGNOSIS — R319 Hematuria, unspecified: Secondary | ICD-10-CM | POA: Diagnosis not present

## 2021-03-28 DIAGNOSIS — R3129 Other microscopic hematuria: Secondary | ICD-10-CM | POA: Diagnosis not present

## 2021-03-28 DIAGNOSIS — N281 Cyst of kidney, acquired: Secondary | ICD-10-CM | POA: Diagnosis not present

## 2021-04-12 DIAGNOSIS — H26492 Other secondary cataract, left eye: Secondary | ICD-10-CM | POA: Diagnosis not present

## 2021-04-12 DIAGNOSIS — H35013 Changes in retinal vascular appearance, bilateral: Secondary | ICD-10-CM | POA: Diagnosis not present

## 2021-04-12 DIAGNOSIS — H35033 Hypertensive retinopathy, bilateral: Secondary | ICD-10-CM | POA: Diagnosis not present

## 2021-04-12 DIAGNOSIS — H35363 Drusen (degenerative) of macula, bilateral: Secondary | ICD-10-CM | POA: Diagnosis not present

## 2021-04-12 DIAGNOSIS — H524 Presbyopia: Secondary | ICD-10-CM | POA: Diagnosis not present

## 2021-08-13 DIAGNOSIS — R399 Unspecified symptoms and signs involving the genitourinary system: Secondary | ICD-10-CM | POA: Diagnosis not present

## 2021-08-22 DIAGNOSIS — K625 Hemorrhage of anus and rectum: Secondary | ICD-10-CM | POA: Diagnosis not present

## 2021-08-23 DIAGNOSIS — K922 Gastrointestinal hemorrhage, unspecified: Secondary | ICD-10-CM | POA: Diagnosis not present

## 2021-08-23 DIAGNOSIS — K5731 Diverticulosis of large intestine without perforation or abscess with bleeding: Secondary | ICD-10-CM | POA: Diagnosis not present

## 2021-08-24 ENCOUNTER — Encounter (HOSPITAL_COMMUNITY): Payer: Self-pay

## 2021-08-24 ENCOUNTER — Observation Stay (HOSPITAL_COMMUNITY): Payer: PPO

## 2021-08-24 ENCOUNTER — Other Ambulatory Visit: Payer: Self-pay

## 2021-08-24 ENCOUNTER — Inpatient Hospital Stay (HOSPITAL_COMMUNITY)
Admission: EM | Admit: 2021-08-24 | Discharge: 2021-08-31 | DRG: 357 | Disposition: A | Discharge status: Home / Self Care | Payer: PPO | Attending: Internal Medicine | Admitting: Internal Medicine

## 2021-08-24 DIAGNOSIS — Z820 Family history of epilepsy and other diseases of the nervous system: Secondary | ICD-10-CM

## 2021-08-24 DIAGNOSIS — D696 Thrombocytopenia, unspecified: Secondary | ICD-10-CM | POA: Diagnosis not present

## 2021-08-24 DIAGNOSIS — Z8249 Family history of ischemic heart disease and other diseases of the circulatory system: Secondary | ICD-10-CM

## 2021-08-24 DIAGNOSIS — R9431 Abnormal electrocardiogram [ECG] [EKG]: Secondary | ICD-10-CM | POA: Diagnosis present

## 2021-08-24 DIAGNOSIS — Z6831 Body mass index (BMI) 31.0-31.9, adult: Secondary | ICD-10-CM | POA: Diagnosis not present

## 2021-08-24 DIAGNOSIS — K219 Gastro-esophageal reflux disease without esophagitis: Secondary | ICD-10-CM | POA: Diagnosis present

## 2021-08-24 DIAGNOSIS — K5730 Diverticulosis of large intestine without perforation or abscess without bleeding: Secondary | ICD-10-CM | POA: Diagnosis not present

## 2021-08-24 DIAGNOSIS — K5791 Diverticulosis of intestine, part unspecified, without perforation or abscess with bleeding: Secondary | ICD-10-CM | POA: Diagnosis not present

## 2021-08-24 DIAGNOSIS — K922 Gastrointestinal hemorrhage, unspecified: Secondary | ICD-10-CM | POA: Diagnosis not present

## 2021-08-24 DIAGNOSIS — Z8719 Personal history of other diseases of the digestive system: Secondary | ICD-10-CM | POA: Diagnosis not present

## 2021-08-24 DIAGNOSIS — F0390 Unspecified dementia without behavioral disturbance: Secondary | ICD-10-CM | POA: Diagnosis present

## 2021-08-24 DIAGNOSIS — R000 Tachycardia, unspecified: Secondary | ICD-10-CM | POA: Diagnosis not present

## 2021-08-24 DIAGNOSIS — Z90711 Acquired absence of uterus with remaining cervical stump: Secondary | ICD-10-CM

## 2021-08-24 DIAGNOSIS — K921 Melena: Secondary | ICD-10-CM | POA: Diagnosis not present

## 2021-08-24 DIAGNOSIS — Z87891 Personal history of nicotine dependence: Secondary | ICD-10-CM | POA: Diagnosis not present

## 2021-08-24 DIAGNOSIS — E785 Hyperlipidemia, unspecified: Secondary | ICD-10-CM | POA: Diagnosis present

## 2021-08-24 DIAGNOSIS — Z9889 Other specified postprocedural states: Secondary | ICD-10-CM | POA: Diagnosis not present

## 2021-08-24 DIAGNOSIS — K5731 Diverticulosis of large intestine without perforation or abscess with bleeding: Secondary | ICD-10-CM | POA: Diagnosis present

## 2021-08-24 DIAGNOSIS — D62 Acute posthemorrhagic anemia: Secondary | ICD-10-CM | POA: Diagnosis present

## 2021-08-24 DIAGNOSIS — Z20822 Contact with and (suspected) exposure to covid-19: Secondary | ICD-10-CM | POA: Diagnosis present

## 2021-08-24 DIAGNOSIS — D72829 Elevated white blood cell count, unspecified: Secondary | ICD-10-CM | POA: Diagnosis not present

## 2021-08-24 DIAGNOSIS — R42 Dizziness and giddiness: Secondary | ICD-10-CM | POA: Diagnosis present

## 2021-08-24 DIAGNOSIS — Z881 Allergy status to other antibiotic agents status: Secondary | ICD-10-CM

## 2021-08-24 DIAGNOSIS — D649 Anemia, unspecified: Secondary | ICD-10-CM | POA: Diagnosis not present

## 2021-08-24 DIAGNOSIS — E669 Obesity, unspecified: Secondary | ICD-10-CM | POA: Diagnosis present

## 2021-08-24 LAB — CBC WITH DIFFERENTIAL/PLATELET
Abs Immature Granulocytes: 0.05 10*3/uL (ref 0.00–0.07)
Basophils Absolute: 0 10*3/uL (ref 0.0–0.1)
Basophils Relative: 0 %
Eosinophils Absolute: 0.2 10*3/uL (ref 0.0–0.5)
Eosinophils Relative: 2 %
HCT: 25.8 % — ABNORMAL LOW (ref 36.0–46.0)
Hemoglobin: 8.1 g/dL — ABNORMAL LOW (ref 12.0–15.0)
Immature Granulocytes: 1 %
Lymphocytes Relative: 34 %
Lymphs Abs: 3.1 10*3/uL (ref 0.7–4.0)
MCH: 26.1 pg (ref 26.0–34.0)
MCHC: 31.4 g/dL (ref 30.0–36.0)
MCV: 83.2 fL (ref 80.0–100.0)
Monocytes Absolute: 0.7 10*3/uL (ref 0.1–1.0)
Monocytes Relative: 8 %
Neutro Abs: 5 10*3/uL (ref 1.7–7.7)
Neutrophils Relative %: 55 %
Platelets: 208 10*3/uL (ref 150–400)
RBC: 3.1 MIL/uL — ABNORMAL LOW (ref 3.87–5.11)
RDW: 15.5 % (ref 11.5–15.5)
WBC: 9.1 10*3/uL (ref 4.0–10.5)
nRBC: 0 % (ref 0.0–0.2)

## 2021-08-24 LAB — URINALYSIS, ROUTINE W REFLEX MICROSCOPIC
Bilirubin Urine: NEGATIVE
Glucose, UA: NEGATIVE mg/dL
Ketones, ur: 5 mg/dL — AB
Leukocytes,Ua: NEGATIVE
Nitrite: NEGATIVE
Protein, ur: NEGATIVE mg/dL
Specific Gravity, Urine: 1.02 (ref 1.005–1.030)
pH: 5 (ref 5.0–8.0)

## 2021-08-24 LAB — COMPREHENSIVE METABOLIC PANEL
ALT: 10 U/L (ref 0–44)
AST: 17 U/L (ref 15–41)
Albumin: 3.2 g/dL — ABNORMAL LOW (ref 3.5–5.0)
Alkaline Phosphatase: 33 U/L — ABNORMAL LOW (ref 38–126)
Anion gap: 6 (ref 5–15)
BUN: 14 mg/dL (ref 8–23)
CO2: 24 mmol/L (ref 22–32)
Calcium: 9.2 mg/dL (ref 8.9–10.3)
Chloride: 109 mmol/L (ref 98–111)
Creatinine, Ser: 0.62 mg/dL (ref 0.44–1.00)
GFR, Estimated: >60 mL/min (ref >60)
Glucose, Bld: 101 mg/dL — ABNORMAL HIGH (ref 70–99)
Potassium: 4.3 mmol/L (ref 3.5–5.1)
Sodium: 139 mmol/L (ref 135–145)
Total Bilirubin: 0.6 mg/dL (ref 0.3–1.2)
Total Protein: 6.2 g/dL — ABNORMAL LOW (ref 6.5–8.1)

## 2021-08-24 LAB — RESP PANEL BY RT-PCR (FLU A&B, COVID) ARPGX2
Influenza A by PCR: NEGATIVE
Influenza B by PCR: NEGATIVE
SARS Coronavirus 2 by RT PCR: NEGATIVE

## 2021-08-24 LAB — PROTIME-INR
INR: 1.1 (ref 0.8–1.2)
Prothrombin Time: 14.3 seconds (ref 11.4–15.2)

## 2021-08-24 LAB — HEMOGLOBIN AND HEMATOCRIT, BLOOD
HCT: 30.3 % — ABNORMAL LOW (ref 36.0–46.0)
Hemoglobin: 10.1 g/dL — ABNORMAL LOW (ref 12.0–15.0)

## 2021-08-24 LAB — POC OCCULT BLOOD, ED: Fecal Occult Bld: POSITIVE — AB

## 2021-08-24 LAB — ABO/RH: ABO/RH(D): O POS

## 2021-08-24 LAB — PREPARE RBC (CROSSMATCH)

## 2021-08-24 MED ORDER — ONDANSETRON HCL 4 MG/2ML IJ SOLN: 4.0000 mg | Freq: Four times a day (QID) | INTRAMUSCULAR | Status: DC | PRN

## 2021-08-24 MED ORDER — ONDANSETRON HCL 4 MG PO TABS: 4.0000 mg | ORAL_TABLET | Freq: Four times a day (QID) | ORAL | Status: DC | PRN

## 2021-08-24 MED ORDER — ACETAMINOPHEN 325 MG PO TABS: 650.0000 mg | ORAL_TABLET | Freq: Four times a day (QID) | ORAL | Status: DC | PRN

## 2021-08-24 MED ORDER — SODIUM CHLORIDE 0.9 % IV SOLN: INTRAVENOUS | Status: DC

## 2021-08-24 MED ORDER — ACETAMINOPHEN 650 MG RE SUPP: 650.0000 mg | Freq: Four times a day (QID) | RECTAL | Status: DC | PRN

## 2021-08-24 MED ORDER — SODIUM CHLORIDE 0.9 % IV SOLN: 10.0000 mL/h | Freq: Once | INTRAVENOUS | Status: DC

## 2021-08-24 MED ORDER — SODIUM CHLORIDE 0.9% FLUSH
3.0000 mL | Freq: Two times a day (BID) | INTRAVENOUS | Status: DC
  Administered 2021-08-24 – 2021-08-31 (×11): 3 mL via INTRAVENOUS

## 2021-08-24 MED ORDER — ENOXAPARIN SODIUM 40 MG/0.4ML IJ SOSY: 40.0000 mg | PREFILLED_SYRINGE | INTRAMUSCULAR | Status: DC

## 2021-08-24 NOTE — H&P (Signed)
History and Physical    Doris Boyd HUD:[id number] DOB: July 01, 1946 DOA: 08/24/2021  Referring MD/NP/PA: Renne Crigler, PA-C PCP: Marden Noble, MD  Patient coming from: Home  Chief Complaint: Blood in stools  I have personally briefly reviewed patient's old medical records in [facility]   HPI: Doris Boyd is a 75 y.o. female with medical history significant of hyperlipidemia, anemia, diverticulosis, and GERD presents with complaints of blood in stools for the last 3 days.  She has had anywhere from 2-4 stools per day since symptoms started.  She initially saw bright red blood, but reports that the blood turn dark in color.  She felt anxious and lightheaded today and leading her to come to the hospital, but denied any loss of consciousness.  Denies having any abdominal pain, nausea, vomiting, fever, chills, weight loss, shortness of breath, or cough.  She is not on aspirin, NSAIDs, or any blood thinners.  Follow-up regularly with her PCP, but is not on any medication.  To her knowledge she has no significant family history of colon cancer. Patient last had a colonoscopy in 05/2017 which was reported to have been normal except for colonic diverticulosis.    ED Course: On admission to the emergency department patient was seen to have positive orthostatic vital signs from sitting to standing with heart rate increasing from 89 to 119.  Labs significant for hemoglobin 8.1 g/dL,  but patient had no recent labs to compare.  Fecal occult blood positive.  Patient had been typed and screened and ordered 1 unit of packed red blood cells due to positive orthostatic vital signs.  [facility] have been formally consulted.  Review of Systems  Constitutional:  Negative for fever.  HENT:  Negative for ear discharge.   Eyes:  Negative for pain and discharge.  Respiratory:  Negative for cough and shortness of breath.   Cardiovascular:  Negative for chest pain and leg swelling.  Gastrointestinal:   Positive for blood in stool. Negative for abdominal pain, nausea and vomiting.  Genitourinary:  Negative for dysuria and hematuria.  Musculoskeletal:  Negative for falls.  Skin:  Negative for itching and rash.  Neurological:  Positive for dizziness. Negative for loss of consciousness.  Psychiatric/Behavioral:  Negative for substance abuse. The patient is nervous/anxious.   All other systems reviewed and are negative.  Past Medical History:  Diagnosis Date   Anemia    Asthma    Cough    for months DUE TO REFLUX SLEEPS ON 2 PILLOWS   Family history of adverse reaction to anesthesia    sister had complications following anesthesia, BP went up   GERD (gastroesophageal reflux disease)    Hyperlipidemia     Past Surgical History:  Procedure Laterality Date   2 toes surgery Right 2007   ABDOMINAL HYSTERECTOMY  yrs ago   PARTIAL   COLONOSCOPY WITH PROPOFOL N/A 04/26/2013   Procedure: COLONOSCOPY WITH PROPOFOL;  Surgeon: Charolett Bumpers, MD;  Location: [facility];  Service: Endoscopy;  Laterality: N/A;   COLONOSCOPY WITH PROPOFOL N/A 06/17/2017   Procedure: COLONOSCOPY WITH PROPOFOL;  Surgeon: Charolett Bumpers, MD;  Location: [facility];  Service: Endoscopy;  Laterality: N/A;   ESOPHAGOGASTRODUODENOSCOPY (EGD) WITH PROPOFOL N/A 04/26/2013   Procedure: ESOPHAGOGASTRODUODENOSCOPY (EGD) WITH PROPOFOL;  Surgeon: Charolett Bumpers, MD;  Location: [facility];  Service: Endoscopy;  Laterality: N/A;   FOOT SURGERY Right 2009     reports that she quit smoking about 42 years ago. Her smoking use  included cigarettes. She has a 7.50 pack-year smoking history. She has never used smokeless tobacco. She reports that she does not drink alcohol and does not use drugs.  Allergies  Allergen Reactions   Ciprofloxacin Other (See Comments)    Muscle aches    Family History  Problem Relation Age of Onset   Hypertension Father    Alzheimer's disease Father    Hypertension Sister    Bipolar disorder  Son     Prior to Admission medications   Medication Sig Start Date End Date Taking? Authorizing Provider  acetaminophen (TYLENOL) 500 MG tablet Take 500-1,000 mg by mouth every 8 (eight) hours as needed for moderate pain, headache or mild pain.   Yes [provider]  benzonatate (TESSALON) 100 MG capsule Use 1 every 4 hour as needed for cough Patient not taking: Reported on 08/24/2021 11/25/12   Storm Frisk, MD  NONFORMULARY OR COMPOUNDED ITEM Antiinflammatory Cream: Diclofenac 3%, Baclofen 2%, Cyclobenzaprine 2%, Lidocaine 2% dispense 120gram, apply 1-2 grams to affected area 3-4 times dailt Patient not taking: Reported on 08/24/2021 09/29/15   Lenn Sink, DPM    Physical Exam:  Constitutional: Female currently in no acute distress able to follow commands Vitals:   08/24/21 1300 08/24/21 1330 08/24/21 1400 08/24/21 1430  BP: 121/69 127/69 106/65 120/63  Pulse: 87 79 81 80  Resp: 18 15 18 16   Temp:      SpO2: 98% 99% 99% 100%   Eyes: PERRL, lids and conjunctivae normal ENMT: Mucous membranes are moist. Posterior pharynx clear of any exudate or lesions.  Neck: normal, supple, no masses, no thyromegaly.  No JVD Respiratory: clear to auscultation bilaterally, no wheezing, no crackles. Normal respiratory effort. No accessory muscle use.  Cardiovascular: Regular rate and rhythm, no murmurs / rubs / gallops.  Trace lower extremity edema. 2+ pedal pulses. No carotid bruits.  Abdomen: no tenderness, no masses palpated. No hepatosplenomegaly. Bowel sounds positive.  Musculoskeletal: no clubbing / cyanosis. No joint deformity upper and lower extremities. Good ROM, no contractures. Normal muscle tone.  Skin: no rashes, lesions, ulcers. No induration Neurologic: CN 2-12 grossly intact. Strength 5/5 in all 4.  Psychiatric: Normal judgment and insight. Alert and oriented x 3. Normal mood.     Labs on Admission: I have personally reviewed following labs and imaging  studies  CBC: Recent Labs  Lab 08/24/21 1450  WBC 9.1  NEUTROABS 5.0  HGB 8.1*  HCT 25.8*  MCV 83.2  PLT 208   Basic Metabolic Panel: Recent Labs  Lab 08/24/21 1450  NA 139  K 4.3  CL 109  CO2 24  GLUCOSE 101*  BUN 14  CREATININE 0.62  CALCIUM 9.2   GFR: CrCl cannot be calculated (Unknown ideal weight.). Liver Function Tests: Recent Labs  Lab 08/24/21 1450  AST 17  ALT 10  ALKPHOS 33*  BILITOT 0.6  PROT 6.2*  ALBUMIN 3.2*   No results for input(s): LIPASE, AMYLASE in the last 168 hours. No results for input(s): AMMONIA in the last 168 hours. Coagulation Profile: Recent Labs  Lab 08/24/21 1450  INR 1.1   Cardiac Enzymes: No results for input(s): CKTOTAL, CKMB, CKMBINDEX, TROPONINI in the last 168 hours. BNP (last 3 results) No results for input(s): PROBNP in the last 8760 hours. HbA1C: No results for input(s): HGBA1C in the last 72 hours. CBG: No results for input(s): GLUCAP in the last 168 hours. Lipid Profile: No results for input(s): CHOL, HDL, LDLCALC, TRIG, CHOLHDL, LDLDIRECT in  the last 72 hours. Thyroid Function Tests: No results for input(s): TSH, T4TOTAL, FREET4, T3FREE, THYROIDAB in the last 72 hours. Anemia Panel: No results for input(s): VITAMINB12, FOLATE, FERRITIN, TIBC, IRON, RETICCTPCT in the last 72 hours. Urine analysis: No results found for: COLORURINE, APPEARANCEUR, LABSPEC, PHURINE, GLUCOSEU, HGBUR, BILIRUBINUR, KETONESUR, PROTEINUR, UROBILINOGEN, NITRITE, LEUKOCYTESUR Sepsis Labs: Recent Results (from the past 240 hour(s))  Resp Panel by RT-PCR (Flu A&B, Covid) Nasopharyngeal Swab     Status: None   Collection Time: 08/24/21  1:10 PM   Specimen: Nasopharyngeal Swab; Nasopharyngeal(NP) swabs in vial transport medium  Result Value Ref Range Status   SARS Coronavirus 2 by RT PCR NEGATIVE NEGATIVE Final    Comment: (NOTE) SARS-CoV-2 target nucleic acids are NOT DETECTED.  The SARS-CoV-2 RNA is generally detectable in upper  respiratory specimens during the acute phase of infection. The lowest concentration of SARS-CoV-2 viral copies this assay can detect is 138 copies/mL. A negative result does not preclude SARS-Cov-2 infection and should not be used as the sole basis for treatment or other patient management decisions. A negative result may occur with  improper specimen collection/handling, submission of specimen other than nasopharyngeal swab, presence of viral mutation(s) within the areas targeted by this assay, and inadequate number of viral copies(<138 copies/mL). A negative result must be combined with clinical observations, patient history, and epidemiological information. The expected result is Negative.  Fact Sheet for Patients:  BloggerCourse.com  Fact Sheet for Healthcare Providers:  SeriousBroker.it  This test is no t yet approved or cleared by the [place] FDA and  has been authorized for detection and/or diagnosis of SARS-CoV-2 by FDA under an Emergency Use Authorization (EUA). This EUA will remain  in effect (meaning this test can be used) for the duration of the COVID-19 declaration under Section 564(b)(1) of the Act, 21 U.S.C.section 360bbb-3(b)(1), unless the authorization is terminated  or revoked sooner.       Influenza A by PCR NEGATIVE NEGATIVE Final   Influenza B by PCR NEGATIVE NEGATIVE Final    Comment: (NOTE) The Xpert Xpress SARS-CoV-2/FLU/RSV plus assay is intended as an aid in the diagnosis of influenza from Nasopharyngeal swab specimens and should not be used as a sole basis for treatment. Nasal washings and aspirates are unacceptable for Xpert Xpress SARS-CoV-2/FLU/RSV testing.  Fact Sheet for Patients: BloggerCourse.com  Fact Sheet for Healthcare Providers: SeriousBroker.it  This test is not yet approved or cleared by the [place] FDA and has been  authorized for detection and/or diagnosis of SARS-CoV-2 by FDA under an Emergency Use Authorization (EUA). This EUA will remain in effect (meaning this test can be used) for the duration of the COVID-19 declaration under Section 564(b)(1) of the Act, 21 U.S.C. section 360bbb-3(b)(1), unless the authorization is terminated or revoked.  Performed at [facility], [address] [place], [place] [address]      Radiological Exams on Admission: No results found.  EKG: Independently reviewed.  Sinus tachycardia 101 bpm  Assessment/Plan Acute blood loss anemia secondary to lower GI bleed: Patient presents with complaints of 3-day history blood in stools.  Initial hemoglobin down to 8.1 g/dL with maroon-colored stools documented to have positive orthostatic vitals from sitting to standing.  Last colonoscopy in 2018 noted universal diverticulosis for which I would suspect symptoms are likely diverticular in nature.  Patient was typed and screened and ordered 1 unit of packed red blood cells.  GI formally evaluated and felt patient could be still actively bleeding for  which CT angiogram was ordered. -Admit to a medical telemetry bed -Continue with transfusion of 1 unit of packed red blood cells -Recheck H&H posttransfusion and transfuse blood products as needed to maintain hemoglobin equal to at least 7 g/dL -Follow-up CT angiogram and consult interventional radiology if found to have signs of active bleeding -Normal saline IV fluids at 75 mL/h -Appreciate GI consultative services, we will follow-up for any further recommendations   DVT prophylaxis: scds Code Status: Full Family Communication: Husband updated at bedside Disposition Plan: Home once medically stable Consults called: Gastroenterology Admission status: Observation  Clydie Braunondell A Brantlee Hinde MD [facility]   If 7PM-7AM, please contact night-coverage   08/24/2021, 3:48 PM

## 2021-08-24 NOTE — ED Provider Notes (Signed)
Doris Boyd Provider Note   CSN: [id number] Arrival date & time: 08/24/21  1224     History Chief Complaint  Patient presents with   Dizziness   Rectal Bleeding    Doris Boyd is a 75 y.o. female presenting to the ED with husband for evaluation of hematochezia and rectal bleeding going on 3 days with associated fatigue and lightheadedness. She denies any abdominal pain, nausea, chest pain, SOB, dysuria, hematuria, or vomiting. She reports her Bms are normal in coloration, and denies any dark and/or tarry stools. She reports today that she felt the need to have a BM, but only blood would be in the toilet. The patient and husband state that the blood is darker in color, but is noticing a lighter color on the toilet tissue. She was seen by urgent care on 08/22/21 and was told to follow up with her PCP. For further evaluation and alarming symptoms, they presented to the ED for further evaluation. The husband mentions that the patient was recently on Cephalexin for a UTI and finished it a week ago. Denies any NSAID use.  The patient and husband deny any medical history, daily medications, or allergies. Surgical history include a c-section and the patient and husband are unsure if she has had a hysterectomy or not. Denies any tobacco, alcohol, or drug use.    The history is provided by the patient and the spouse (Husband states that the patient has mild dementia, but he will be here to support story and for assistance.).  Dizziness Associated symptoms: blood in stool   Associated symptoms: no chest pain, no diarrhea, no headaches, no nausea, no shortness of breath and no vomiting   Rectal Bleeding Associated symptoms: light-headedness   Associated symptoms: no abdominal pain, no dizziness, no fever and no vomiting  The history is provided by the patient and the spouse (Husband states that the patient has mild dementia, but he will be here to support story and  for assistance.).  Dizziness Rectal Bleeding Associated symptoms: dizziness  The history is provided by the patient and the spouse.  Dizziness Rectal Bleeding Associated symptoms: dizziness       Past Medical History:  Diagnosis Date   Anemia    Asthma    Cough    for months DUE TO REFLUX SLEEPS ON 2 PILLOWS   Family history of adverse reaction to anesthesia    sister had complications following anesthesia, BP went up   GERD (gastroesophageal reflux disease)    Hyperlipidemia     Patient Active Problem List   Diagnosis Date Noted   GI bleed 08/24/2021   Cough 11/25/2012   HYPERLIPIDEMIA 03/26/2010   GERD 03/26/2010    Past Surgical History:  Procedure Laterality Date   2 toes surgery Right 2007   ABDOMINAL HYSTERECTOMY  yrs ago   PARTIAL   COLONOSCOPY WITH PROPOFOL N/A 04/26/2013   Procedure: COLONOSCOPY WITH PROPOFOL;  Surgeon: Charolett BumpersMartin K Wynter, MD;  Location: [facility];  Service: Endoscopy;  Laterality: N/A;   COLONOSCOPY WITH PROPOFOL N/A 06/17/2017   Procedure: COLONOSCOPY WITH PROPOFOL;  Surgeon: Charolett BumpersJohnson, Martin K, MD;  Location: [facility];  Service: Endoscopy;  Laterality: N/A;   ESOPHAGOGASTRODUODENOSCOPY (EGD) WITH PROPOFOL N/A 04/26/2013   Procedure: ESOPHAGOGASTRODUODENOSCOPY (EGD) WITH PROPOFOL;  Surgeon: Charolett BumpersMartin K Morones, MD;  Location: [facility];  Service: Endoscopy;  Laterality: N/A;   FOOT SURGERY Right 2009     OB History   No obstetric history on file.  Family History  Problem Relation Age of Onset   Hypertension Father    Alzheimer's disease Father    Hypertension Sister    Bipolar disorder Son     Social History   Tobacco Use   Smoking status: Former    Packs/day: 0.50    Years: 15.00    Pack years: 7.50    Types: Cigarettes    Quit date: 12/23/1978    Years since quitting: 42.6   Smokeless tobacco: Never  Vaping Use   Vaping Use: Never used  Substance Use Topics   Alcohol use: No   Drug use: No    Home  Medications Prior to Admission medications   Medication Sig Start Date End Date Taking? Authorizing Provider  acetaminophen (TYLENOL) 500 MG tablet Take 500-1,000 mg by mouth every 8 (eight) hours as needed for moderate pain, headache or mild pain.   Yes [provider]  benzonatate (TESSALON) 100 MG capsule Use 1 every 4 hour as needed for cough Patient not taking: Reported on 08/24/2021 11/25/12   Storm Frisk, MD  NONFORMULARY OR COMPOUNDED ITEM Antiinflammatory Cream: Diclofenac 3%, Baclofen 2%, Cyclobenzaprine 2%, Lidocaine 2% dispense 120gram, apply 1-2 grams to affected area 3-4 times dailt Patient not taking: Reported on 08/24/2021 09/29/15   Lenn Sink, DPM    Allergies    Ciprofloxacin  Review of Systems   Review of Systems  Constitutional:  Positive for fatigue. Negative for chills, fever and unexpected weight change.  HENT:  Negative for congestion and rhinorrhea.   Eyes:  Negative for redness and visual disturbance.  Respiratory:  Negative for cough and shortness of breath.   Cardiovascular:  Negative for chest pain and leg swelling.  Gastrointestinal:  Positive for anal bleeding, blood in stool and hematochezia. Negative for abdominal pain, constipation, diarrhea, nausea, rectal pain and vomiting.  Genitourinary:  Negative for dysuria and hematuria.  Musculoskeletal:  Negative for arthralgias and back pain.  Skin:  Negative for color change and rash.  Neurological:  Positive for light-headedness. Negative for dizziness, syncope and headaches.   Physical Exam Updated Vital Signs BP 129/65   Pulse 81   Temp 98.1 F (36.7 C)   Resp 15   SpO2 100%   Physical Exam Vitals and nursing note reviewed.  Constitutional:      General: She is not in acute distress.    Appearance: Normal appearance. She is not toxic-appearing.  HENT:     Head: Normocephalic and atraumatic.  Eyes:     General: No scleral icterus. Cardiovascular:     Rate and Rhythm: Regular  rhythm. Tachycardia present.  Pulmonary:     Effort: Pulmonary effort is normal.     Breath sounds: Normal breath sounds.  Abdominal:     General: Abdomen is flat. Bowel sounds are normal.     Palpations: Abdomen is soft. There is no mass.     Tenderness: There is no abdominal tenderness. There is no guarding or rebound.  Genitourinary:    Rectum: Guaiac result positive.     Comments: Patient has a small, flesh colored hemorrhoid at the 6 o'clock position. No fissures or lesions visualized. Maroon stool noticed on rectal exam.  Musculoskeletal:        General: No deformity.     Cervical back: Normal range of motion.  Skin:    General: Skin is warm and dry.  Neurological:     General: No focal deficit present.     Mental Status: She is  alert. Mental status is at baseline.    ED Results / Procedures / Treatments   Labs (all labs ordered are listed, but only abnormal results are displayed) Labs Reviewed  COMPREHENSIVE METABOLIC PANEL - Abnormal; Notable for the following components:      Result Value   Glucose, Bld 101 (*)    Total Protein 6.2 (*)    Albumin 3.2 (*)    Alkaline Phosphatase 33 (*)    All other components within normal limits  CBC WITH DIFFERENTIAL/PLATELET - Abnormal; Notable for the following components:   RBC 3.10 (*)    Hemoglobin 8.1 (*)    HCT 25.8 (*)    All other components within normal limits  POC OCCULT BLOOD, ED - Abnormal; Notable for the following components:   Fecal Occult Bld POSITIVE (*)    All other components within normal limits  RESP PANEL BY RT-PCR (FLU A&B, COVID) [id number]  PROTIME-INR  URINALYSIS, ROUTINE W REFLEX MICROSCOPIC  HEMOGLOBIN AND HEMATOCRIT, BLOOD  TYPE AND SCREEN  ABO/RH  PREPARE RBC (CROSSMATCH)    EKG EKG Interpretation  Date/Time:  Friday August 24 2021 12:27:04 EDT Ventricular Rate:  101 PR Interval:  130 QRS Duration: 72 QT Interval:  340 QTC Calculation: 440 R Axis:   49 Text Interpretation: Sinus  tachycardia Nonspecific T wave abnormality Abnormal ECG Axis normal Diffuse T wave flattening especially in the lateral V leads and new when compared to EKG from 2008 Confirmed by Pieter Partridge ([id number]) on 08/24/2021 12:38:11 PM  Radiology No results found.  Procedures .Critical Care  Date/Time: 08/24/2021 3:49 PM Performed by: Achille Rich, PA-C Authorized by: Achille Rich, PA-C   Critical care provider statement:    Critical care time (minutes):  35   Critical care was time spent personally by me on the following activities:  Obtaining history from patient or surrogate, examination of patient, ordering and performing treatments and interventions, ordering and review of laboratory studies, re-evaluation of patient's condition, review of old charts, development of treatment plan with patient or surrogate and discussions with consultants   I assumed direction of critical care for this patient from another provider in my specialty: no     Care discussed with: admitting provider     Medications Ordered in ED Medications  0.9 %  sodium chloride infusion (has no administration in time range)  enoxaparin (LOVENOX) injection 40 mg (has no administration in time range)  sodium chloride flush (NS) 0.9 % injection 3 mL (has no administration in time range)    ED Course  I have reviewed the triage vital signs and the nursing notes.  Pertinent labs & imaging results that were available during my care of the patient were reviewed by me and considered in my medical decision making (see chart for details).  Doris Boyd is a 75 y/o otherwise healthy F presenting with  hematochezia and rectal bleeding for the past 3 days. Differential includes diverticulosis, diverticulitis, colon neoplasm, adenomas, mesenteric ischemia.   Per outside records, on 08/22/21 the patient was seen at [facility] for painless rectal bleeding and was found to have a positive hemoccult and a H&H of 12/37.9.  Orthostatic blood pressure stable, but patient became tachycardic upon standing.  CBC shows a H&H of 8.1/25.8. Obtained consent from patient for blood transfusion, and one unit of PRBC ordered. Discussed admission with the patient and husband who are agreeable with plan.  Patient is admitted to Dr. Katrinka Blazing.    MDM Rules/Calculators/A&P  Final Clinical Impression(s) / ED Diagnoses Final diagnoses:  Lower GI bleed  Dizziness    Rx / DC Orders ED Discharge Orders     None        Achille Rich, PA-C 08/24/21 1613    Koleen Distance, MD 08/27/21 [phone]

## 2021-08-24 NOTE — Consult Note (Signed)
Referring Provider: ED Primary Care Physician:  Marden Noble, MD Primary Gastroenterologist:  Arna Medici (former patient of Dr. Laural Benes)  Reason for Consultation:  Painless hematochezia  HPI: Doris Boyd is a 75 y.o. female with past medical history of asthma, GERD, and anemia presenting for consultation of painless hematochezia.  Patient states she started passing dark red stools on Wednesday 8/31.  She had 4 stools on Wednesday, 2 stools on Thursday, and she has had an additional 2-3 stools thus far today.  She denies any associated abdominal cramping, chest pain, shortness of breath, or dizziness.  Further denies nausea, vomiting, dysphagia, changes in appetite, unexplained weight loss.  No blood thinner, aspirin, or NSAID use.  Family history pertinent for father with diverticulitis.  No known family history of colon cancer or gastrointestinal malignancy.  Patient's last colonoscopy was 05/2017 and showed universal diverticulosis.  Past Medical History:  Diagnosis Date   Anemia    Asthma    Cough    for months DUE TO REFLUX SLEEPS ON 2 PILLOWS   Family history of adverse reaction to anesthesia    sister had complications following anesthesia, BP went up   GERD (gastroesophageal reflux disease)    Hyperlipidemia     Past Surgical History:  Procedure Laterality Date   2 toes surgery Right 2007   ABDOMINAL HYSTERECTOMY  yrs ago   PARTIAL   COLONOSCOPY WITH PROPOFOL N/A 04/26/2013   Procedure: COLONOSCOPY WITH PROPOFOL;  Surgeon: Charolett Bumpers, MD;  Location: [facility];  Service: Endoscopy;  Laterality: N/A;   COLONOSCOPY WITH PROPOFOL N/A 06/17/2017   Procedure: COLONOSCOPY WITH PROPOFOL;  Surgeon: Charolett Bumpers, MD;  Location: [facility];  Service: Endoscopy;  Laterality: N/A;   ESOPHAGOGASTRODUODENOSCOPY (EGD) WITH PROPOFOL N/A 04/26/2013   Procedure: ESOPHAGOGASTRODUODENOSCOPY (EGD) WITH PROPOFOL;  Surgeon: Charolett Bumpers, MD;  Location: [facility];  Service:  Endoscopy;  Laterality: N/A;   FOOT SURGERY Right 2009    Prior to Admission medications   Medication Sig Start Date End Date Taking? Authorizing Provider  acetaminophen (TYLENOL) 500 MG tablet Take 500-1,000 mg by mouth every 8 (eight) hours as needed for moderate pain, headache or mild pain.   Yes [provider]  benzonatate (TESSALON) 100 MG capsule Use 1 every 4 hour as needed for cough Patient not taking: Reported on 08/24/2021 11/25/12   Storm Frisk, MD  NONFORMULARY OR COMPOUNDED ITEM Antiinflammatory Cream: Diclofenac 3%, Baclofen 2%, Cyclobenzaprine 2%, Lidocaine 2% dispense 120gram, apply 1-2 grams to affected area 3-4 times dailt Patient not taking: Reported on 08/24/2021 09/29/15   Lenn Sink, DPM    Scheduled Meds:  enoxaparin (LOVENOX) injection  40 mg Subcutaneous Q24H   sodium chloride flush  3 mL Intravenous Q12H   Continuous Infusions:  sodium chloride     PRN Meds:.  Allergies as of 08/24/2021 - Review Complete 08/24/2021  Allergen Reaction Noted   Ciprofloxacin Other (See Comments)     Family History  Problem Relation Age of Onset   Hypertension Father    Alzheimer's disease Father    Hypertension Sister    Bipolar disorder Son     Social History   Socioeconomic History   Marital status: Married    Spouse name: Not on file   Number of children: Not on file   Years of education: Not on file   Highest education level: Not on file  Occupational History   Not on file  Tobacco Use   Smoking status: Former  Packs/day: 0.50    Years: 15.00    Pack years: 7.50    Types: Cigarettes    Quit date: 12/23/1978    Years since quitting: 42.6   Smokeless tobacco: Never  Vaping Use   Vaping Use: Never used  Substance and Sexual Activity   Alcohol use: No   Drug use: No   Sexual activity: Not on file  Other Topics Concern   Not on file  Social History Narrative   Not on file   Social Determinants of Health   Financial Resource  Strain: Not on file  Food Insecurity: Not on file  Transportation Needs: Not on file  Physical Activity: Not on file  Stress: Not on file  Social Connections: Not on file  Intimate Partner Violence: Not on file    Review of Systems: Review of Systems  Constitutional:  Negative for fever and weight loss.  HENT:  Negative for hearing loss and tinnitus.   Eyes:  Negative for pain and redness.  Respiratory:  Negative for cough and shortness of breath.   Cardiovascular:  Negative for chest pain and palpitations.  Gastrointestinal:  Positive for blood in stool. Negative for abdominal pain, constipation, diarrhea, heartburn, melena, nausea and vomiting.  Genitourinary:  Negative for flank pain and hematuria.  Musculoskeletal:  Negative for falls and joint pain.  Skin:  Negative for itching and rash.  Neurological:  Negative for seizures and loss of consciousness.  Psychiatric/Behavioral:  Negative for substance abuse. The patient is not nervous/anxious.    Physical Exam: Vital signs: Vitals:   08/24/21 1430 08/24/21 1600  BP: 120/63 129/65  Pulse: 80 81  Resp: 16 15  Temp:    SpO2: 100% 100%     Physical Exam Vitals reviewed.  Constitutional:      General: She is not in acute distress. HENT:     Head: Normocephalic and atraumatic.     Nose: Nose normal. No congestion.     Mouth/Throat:     Mouth: Mucous membranes are moist.     Pharynx: Oropharynx is clear.  Eyes:     Extraocular Movements: Extraocular movements intact.     Comments: Conjunctival pallor  Cardiovascular:     Rate and Rhythm: Normal rate and regular rhythm.  Pulmonary:     Effort: Pulmonary effort is normal. No respiratory distress.  Abdominal:     General: Bowel sounds are normal. There is no distension.     Palpations: Abdomen is soft. There is no mass.     Tenderness: There is no abdominal tenderness. There is no guarding or rebound.     Hernia: No hernia is present.  Musculoskeletal:     Cervical  back: Normal range of motion and neck supple.  Skin:    General: Skin is warm and dry.  Neurological:     General: No focal deficit present.     Mental Status: She is alert and oriented to person, place, and time.  Psychiatric:        Mood and Affect: Mood normal.        Behavior: Behavior normal. Behavior is cooperative.     GI:  Lab Results: Recent Labs    08/24/21 1450  WBC 9.1  HGB 8.1*  HCT 25.8*  PLT 208   BMET Recent Labs    08/24/21 1450  NA 139  K 4.3  CL 109  CO2 24  GLUCOSE 101*  BUN 14  CREATININE 0.62  CALCIUM 9.2   LFT Recent Labs  08/24/21 1450  PROT 6.2*  ALBUMIN 3.2*  AST 17  ALT 10  ALKPHOS 33*  BILITOT 0.6   PT/INR Recent Labs    08/24/21 1450  LABPROT 14.3  INR 1.1     Studies/Results: No results found.  Impression: Painless hematochezia, most consistent with diverticular bleeding.  Patient with known diverticulosis per colonoscopy in 2018. -Hemoglobin 8.1, decreased from 10.6 yesterday 9/1 -Normal INR 1.1 -Normal renal function  Plan: Recommend proceeding with CT-A to assess for ongoing active bleeding.  If positive, recommend IR consultation for embolization.  If negative, okay to advance to clear liquids.  Agree with inpatient admission for further observation.  Continue to monitor H&H with transfusion as needed to maintain hemoglobin greater than 7.  [facility] will follow.   LOS: 0 days   Zakaiya Lares  PA-C 08/24/2021, 4:39 PM  Contact #  [phone]

## 2021-08-24 NOTE — ED Provider Notes (Signed)
Patient seen in conjunction with [facility].  Patient here with several days of hematochezia, maroon stool.  Today she began feeling some dizziness.  She denies chest pain or shortness of breath.  Hemoglobin has dropped from 12 >> 8.1 and is continuing to have red blood passing per rectum.  No fevers or abdominal pain.  Patient will be seen by [facility], Dr. Marca Ancona.  Plan for admission to medicine.  1 unit of blood ordered due to symptomatic anemia and ongoing bleeding.  Patient agreeable to plan.  BP 120/63   Pulse 80   Temp 98.1 F (36.7 C)   Resp 16   SpO2 100%     Renne Crigler, PA-C 08/24/21 1547    Koleen Distance, MD 08/27/21 [phone]

## 2021-08-24 NOTE — ED Triage Notes (Signed)
Pt reports bloody stools since yesterday with associated dizziness that started this morning. Pt denies abd pain nausea or vomiting. Pt seen at [facility] yesterday and states "they did nothing for me". Pt a.o

## 2021-08-25 ENCOUNTER — Observation Stay (HOSPITAL_COMMUNITY): Payer: PPO

## 2021-08-25 ENCOUNTER — Encounter (HOSPITAL_COMMUNITY): Payer: Self-pay | Admitting: Internal Medicine

## 2021-08-25 DIAGNOSIS — Z20822 Contact with and (suspected) exposure to covid-19: Secondary | ICD-10-CM | POA: Diagnosis present

## 2021-08-25 DIAGNOSIS — F0390 Unspecified dementia without behavioral disturbance: Secondary | ICD-10-CM | POA: Diagnosis present

## 2021-08-25 DIAGNOSIS — Z87891 Personal history of nicotine dependence: Secondary | ICD-10-CM | POA: Diagnosis not present

## 2021-08-25 DIAGNOSIS — Z6831 Body mass index (BMI) 31.0-31.9, adult: Secondary | ICD-10-CM | POA: Diagnosis not present

## 2021-08-25 DIAGNOSIS — D696 Thrombocytopenia, unspecified: Secondary | ICD-10-CM | POA: Diagnosis not present

## 2021-08-25 DIAGNOSIS — K5731 Diverticulosis of large intestine without perforation or abscess with bleeding: Secondary | ICD-10-CM | POA: Diagnosis present

## 2021-08-25 DIAGNOSIS — D72829 Elevated white blood cell count, unspecified: Secondary | ICD-10-CM | POA: Diagnosis not present

## 2021-08-25 DIAGNOSIS — D62 Acute posthemorrhagic anemia: Secondary | ICD-10-CM | POA: Diagnosis present

## 2021-08-25 DIAGNOSIS — Z8249 Family history of ischemic heart disease and other diseases of the circulatory system: Secondary | ICD-10-CM | POA: Diagnosis not present

## 2021-08-25 DIAGNOSIS — E785 Hyperlipidemia, unspecified: Secondary | ICD-10-CM | POA: Diagnosis present

## 2021-08-25 DIAGNOSIS — Z90711 Acquired absence of uterus with remaining cervical stump: Secondary | ICD-10-CM | POA: Diagnosis not present

## 2021-08-25 DIAGNOSIS — K219 Gastro-esophageal reflux disease without esophagitis: Secondary | ICD-10-CM | POA: Diagnosis present

## 2021-08-25 DIAGNOSIS — Z881 Allergy status to other antibiotic agents status: Secondary | ICD-10-CM | POA: Diagnosis not present

## 2021-08-25 DIAGNOSIS — Z820 Family history of epilepsy and other diseases of the nervous system: Secondary | ICD-10-CM | POA: Diagnosis not present

## 2021-08-25 DIAGNOSIS — K5730 Diverticulosis of large intestine without perforation or abscess without bleeding: Secondary | ICD-10-CM | POA: Diagnosis not present

## 2021-08-25 DIAGNOSIS — E669 Obesity, unspecified: Secondary | ICD-10-CM | POA: Diagnosis present

## 2021-08-25 DIAGNOSIS — R42 Dizziness and giddiness: Secondary | ICD-10-CM | POA: Diagnosis present

## 2021-08-25 DIAGNOSIS — K922 Gastrointestinal hemorrhage, unspecified: Secondary | ICD-10-CM | POA: Diagnosis not present

## 2021-08-25 DIAGNOSIS — K5791 Diverticulosis of intestine, part unspecified, without perforation or abscess with bleeding: Secondary | ICD-10-CM | POA: Diagnosis not present

## 2021-08-25 DIAGNOSIS — R9431 Abnormal electrocardiogram [ECG] [EKG]: Secondary | ICD-10-CM | POA: Diagnosis present

## 2021-08-25 LAB — BUN: BUN: 8 mg/dL (ref 8–23)

## 2021-08-25 LAB — HEMOGLOBIN AND HEMATOCRIT, BLOOD
HCT: 27.5 % — ABNORMAL LOW (ref 36.0–46.0)
HCT: 28.2 % — ABNORMAL LOW (ref 36.0–46.0)
Hemoglobin: 8.9 g/dL — ABNORMAL LOW (ref 12.0–15.0)
Hemoglobin: 9 g/dL — ABNORMAL LOW (ref 12.0–15.0)

## 2021-08-25 LAB — CBC
HCT: 26.1 % — ABNORMAL LOW (ref 36.0–46.0)
Hemoglobin: 8.4 g/dL — ABNORMAL LOW (ref 12.0–15.0)
MCH: 26.3 pg (ref 26.0–34.0)
MCHC: 32.2 g/dL (ref 30.0–36.0)
MCV: 81.8 fL (ref 80.0–100.0)
Platelets: 211 10*3/uL (ref 150–400)
RBC: 3.19 MIL/uL — ABNORMAL LOW (ref 3.87–5.11)
RDW: 15 % (ref 11.5–15.5)
WBC: 9.9 10*3/uL (ref 4.0–10.5)
nRBC: 0 % (ref 0.0–0.2)

## 2021-08-25 LAB — BASIC METABOLIC PANEL
Anion gap: 8 (ref 5–15)
BUN: 12 mg/dL (ref 8–23)
CO2: 24 mmol/L (ref 22–32)
Calcium: 9.1 mg/dL (ref 8.9–10.3)
Chloride: 107 mmol/L (ref 98–111)
Creatinine, Ser: 0.53 mg/dL (ref 0.44–1.00)
GFR, Estimated: >60 mL/min (ref >60)
Glucose, Bld: 101 mg/dL — ABNORMAL HIGH (ref 70–99)
Potassium: 4.2 mmol/L (ref 3.5–5.1)
Sodium: 139 mmol/L (ref 135–145)

## 2021-08-25 MED ORDER — PANTOPRAZOLE 80MG IVPB - SIMPLE MED
80.0000 mg | Freq: Once | INTRAVENOUS | Status: AC
  Administered 2021-08-26: 80 mg via INTRAVENOUS
  Filled 2021-08-25: qty 80

## 2021-08-25 MED ORDER — IOHEXOL 350 MG/ML SOLN
100.0000 mL | Freq: Once | INTRAVENOUS | Status: AC | PRN
  Administered 2021-08-25: 100 mL via INTRAVENOUS

## 2021-08-25 MED ORDER — PANTOPRAZOLE SODIUM 40 MG IV SOLR: 40.0000 mg | Freq: Two times a day (BID) | INTRAVENOUS | Status: DC

## 2021-08-25 MED ORDER — PANTOPRAZOLE INFUSION (NEW) - SIMPLE MED
8.0000 mg/h | INTRAVENOUS | Status: DC
  Administered 2021-08-25 – 2021-08-26 (×2): 8 mg/h via INTRAVENOUS
  Filled 2021-08-25 (×2): qty 80

## 2021-08-25 NOTE — Progress Notes (Signed)
Subjective: No further bleeding overnight.  Objective: Vital signs in last 24 hours: Temp:  [97.8 F (36.6 C)-98.3 F (36.8 C)] 98.3 F (36.8 C) (09/03 0535) Pulse Rate:  [72-103] 85 (09/03 0535) Resp:  [14-23] 17 (09/03 0535) BP: (106-149)/(54-82) 127/60 (09/03 0535) SpO2:  [98 %-100 %] 98 % (09/03 0535) Weight change:  Last BM Date: 08/24/21  PE: GEN: NAD ABD:  Soft, non-tender  Lab Results: CBC    Component Value Date/Time   WBC 9.9 08/25/2021 0228   RBC 3.19 (L) 08/25/2021 0228   HGB 8.4 (L) 08/25/2021 0228   HCT 26.1 (L) 08/25/2021 0228   PLT 211 08/25/2021 0228   MCV 81.8 08/25/2021 0228   MCH 26.3 08/25/2021 0228   MCHC 32.2 08/25/2021 0228   RDW 15.0 08/25/2021 0228   LYMPHSABS 3.1 08/24/2021 1450   MONOABS 0.7 08/24/2021 1450   EOSABS 0.2 08/24/2021 1450   BASOSABS 0.0 08/24/2021 1450   Studies/Results: CTA universal diverticulosis, no active bleeding site seen.  Assessment:   Painless hematochezia, resolved, likely from colonic diverticulosis, CTA negative for active bleeding.  Acute blood loss anemia.  Plan:   Serial CBCs, blood transfusion as needed.  Advance diet to clear liquids for lunch and, if not rebleeding, full liquids for dinner. Do not anticipate need for colonoscopy at this point. [facility] will follow.   Freddy Jaksch 08/25/2021, 10:38 AM   Cell [phone] If no answer or after 5 PM call [phone]

## 2021-08-25 NOTE — Progress Notes (Addendum)
PROGRESS NOTE  Doris Boyd  WUJ:[id number] DOB: 1946/10/12 DOA: 08/24/2021 PCP: Marden NobleGates, Robert, MD   Brief Narrative: Doris Boyd is a 75 y.o. female with a history of pandiverticulosis who takes no medications and presented to the ED 9/2 with 3 days of painless red blood in stool turning more dark over time. In the ED she was orthostatic with hgb 8.1g/dl (from 95A/OZ12g/dl on 3/088/31), +FOBT, so GI was consulted, 1u PRBCs transfused, and patient admitted.  Assessment & Plan: Principal Problem:   GI bleed Active Problems:   Acute blood loss anemia  Acute blood loss anemia due to lower GI bleeding, presumed diverticular bleed: CTA confirms findings from colonoscopy 2018 of widespread diverticulosis, negative for bleeding source or vascular malformation this AM. - Appreciate GI following, currently hoping to avoid colonoscopy. ADAT, no prep currently.  - Serial H/H to continue. s/p 1u PRBCs, hgb 8.1 >> 8.4. Transfusion threshold is 7g/dl or 8 w/active bleeding.  Obesity: Estimated body mass index is 31.83 kg/m as calculated from the following:   Height as of 06/17/17: 5\' 2"  (1.575 m).   Weight as of 06/17/17: 78.9 kg.  DVT prophylaxis: SCDs Code Status: Full Family Communication: None at bedside Disposition Plan:  Status is: Observation  The patient will require care spanning > 2 midnights and should be moved to inpatient because:  Acute, transfusion-dependent blood loss anemia with need for continued monitoring  Dispo: The patient is from: Home              Anticipated d/c is to: Home              Patient currently is not medically stable to d/c.  [facility]  Procedures:  None  Antimicrobials: None   Subjective: Had painless hematochezia last night, none thus far today. Took clears without nausea or vomiting. Doesn't and hasn't had abdominal pain.   Objective: BP 122/63 (BP Location: Left Arm)   Pulse 78   Temp 98 F (36.7 C) (Oral)   Resp 16   SpO2 100%    Gen: 75 y.o. female in no distress  Pulm: Non-labored breathing room air. Clear to auscultation bilaterally.  CV: Regular rate and rhythm. No murmur, rub, or gallop. No JVD, no pedal edema. GI: Abdomen soft, non-tender, non-distended, with normoactive bowel sounds. No organomegaly or masses felt. Ext: Warm, no deformities Skin: No rashes, lesions or ulcers Neuro: Alert and oriented. No focal neurological deficits. Psych: Judgement and insight appear normal. Mood & affect appropriate.   Data Reviewed: I have personally reviewed following labs and imaging studies  CBC: Recent Labs  Lab 08/24/21 1450 08/24/21 2151 08/25/21 0228 08/25/21 1125  WBC 9.1  --  9.9  --   NEUTROABS 5.0  --   --   --   HGB 8.1* 10.1* 8.4* 8.9*  HCT 25.8* 30.3* 26.1* 27.5*  MCV 83.2  --  81.8  --   PLT 208  --  211  --    Basic Metabolic Panel: Recent Labs  Lab 08/24/21 1450 08/25/21 0228  NA 139 139  K 4.3 4.2  CL 109 107  CO2 24 24  GLUCOSE 101* 101*  BUN 14 12  CREATININE 0.62 0.53  CALCIUM 9.2 9.1   GFR: CrCl cannot be calculated (Unknown ideal weight.). Liver Function Tests: Recent Labs  Lab 08/24/21 1450  AST 17  ALT 10  ALKPHOS 33*  BILITOT 0.6  PROT 6.2*  ALBUMIN 3.2*   No results for input(s):  LIPASE, AMYLASE in the last 168 hours. No results for input(s): AMMONIA in the last 168 hours. Coagulation Profile: Recent Labs  Lab 08/24/21 1450  INR 1.1   Cardiac Enzymes: No results for input(s): CKTOTAL, CKMB, CKMBINDEX, TROPONINI in the last 168 hours. BNP (last 3 results) No results for input(s): PROBNP in the last 8760 hours. HbA1C: No results for input(s): HGBA1C in the last 72 hours. CBG: No results for input(s): GLUCAP in the last 168 hours. Lipid Profile: No results for input(s): CHOL, HDL, LDLCALC, TRIG, CHOLHDL, LDLDIRECT in the last 72 hours. Thyroid Function Tests: No results for input(s): TSH, T4TOTAL, FREET4, T3FREE, THYROIDAB in the last 72 hours. Anemia  Panel: No results for input(s): VITAMINB12, FOLATE, FERRITIN, TIBC, IRON, RETICCTPCT in the last 72 hours. Urine analysis:    Component Value Date/Time   COLORURINE YELLOW 08/24/2021 1932   APPEARANCEUR CLEAR 08/24/2021 1932   LABSPEC 1.020 08/24/2021 1932   PHURINE 5.0 08/24/2021 1932   GLUCOSEU NEGATIVE 08/24/2021 1932   HGBUR MODERATE (A) 08/24/2021 1932   BILIRUBINUR NEGATIVE 08/24/2021 1932   KETONESUR 5 (A) 08/24/2021 1932   PROTEINUR NEGATIVE 08/24/2021 1932   NITRITE NEGATIVE 08/24/2021 1932   LEUKOCYTESUR NEGATIVE 08/24/2021 1932   Recent Results (from the past 240 hour(s))  Resp Panel by RT-PCR (Flu A&B, Covid) Nasopharyngeal Swab     Status: None   Collection Time: 08/24/21  1:10 PM   Specimen: Nasopharyngeal Swab; Nasopharyngeal(NP) swabs in vial transport medium  Result Value Ref Range Status   SARS Coronavirus 2 by RT PCR NEGATIVE NEGATIVE Final    Comment: (NOTE) SARS-CoV-2 target nucleic acids are NOT DETECTED.  The SARS-CoV-2 RNA is generally detectable in upper respiratory specimens during the acute phase of infection. The lowest concentration of SARS-CoV-2 viral copies this assay can detect is 138 copies/mL. A negative result does not preclude SARS-Cov-2 infection and should not be used as the sole basis for treatment or other patient management decisions. A negative result may occur with  improper specimen collection/handling, submission of specimen other than nasopharyngeal swab, presence of viral mutation(s) within the areas targeted by this assay, and inadequate number of viral copies(<138 copies/mL). A negative result must be combined with clinical observations, patient history, and epidemiological information. The expected result is Negative.  Fact Sheet for Patients:  BloggerCourse.com  Fact Sheet for Healthcare Providers:  SeriousBroker.it  This test is no t yet approved or cleared by the [place] FDA and  has been authorized for detection and/or diagnosis of SARS-CoV-2 by FDA under an Emergency Use Authorization (EUA). This EUA will remain  in effect (meaning this test can be used) for the duration of the COVID-19 declaration under Section 564(b)(1) of the Act, 21 U.S.C.section 360bbb-3(b)(1), unless the authorization is terminated  or revoked sooner.       Influenza A by PCR NEGATIVE NEGATIVE Final   Influenza B by PCR NEGATIVE NEGATIVE Final    Comment: (NOTE) The Xpert Xpress SARS-CoV-2/FLU/RSV plus assay is intended as an aid in the diagnosis of influenza from Nasopharyngeal swab specimens and should not be used as a sole basis for treatment. Nasal washings and aspirates are unacceptable for Xpert Xpress SARS-CoV-2/FLU/RSV testing.  Fact Sheet for Patients: BloggerCourse.com  Fact Sheet for Healthcare Providers: SeriousBroker.it  This test is not yet approved or cleared by the [place] FDA and has been authorized for detection and/or diagnosis of SARS-CoV-2 by FDA under an Emergency Use Authorization (EUA). This EUA will remain in effect (meaning  this test can be used) for the duration of the COVID-19 declaration under Section 564(b)(1) of the Act, 21 U.S.C. section 360bbb-3(b)(1), unless the authorization is terminated or revoked.  Performed at [facility], [address], [place], [place] [address]       Radiology Studies: DG CHEST PORT 1 VIEW  Result Date: 08/24/2021 CLINICAL DATA:  GI bleed.  Dizziness. EXAM: PORTABLE CHEST 1 VIEW COMPARISON:  02/05/2016 FINDINGS: The cardiomediastinal contours are normal. Minimal elevation of right hemidiaphragm which is similar to prior. Pulmonary vasculature is normal. No consolidation, pleural effusion, or pneumothorax. No acute osseous abnormalities are seen. IMPRESSION: No acute chest findings. Electronically Signed   By: Narda Rutherford M.D.   On:  08/24/2021 16:48   CT Angio Abd/Pel w/ and/or w/o  Result Date: 08/25/2021 CLINICAL DATA:  GI bleed with passage of dark red bowel movements over the last several days. EXAM: CT ANGIOGRAPHY ABDOMEN AND PELVIS WITH CONTRAST AND WITHOUT CONTRAST TECHNIQUE: Multidetector CT imaging of the abdomen and pelvis was performed using the standard protocol during bolus administration of intravenous contrast. Multiplanar reconstructed images and MIPs were obtained and reviewed to evaluate the vascular anatomy. CONTRAST:  OMNIPAQUE IOHEXOL 350 MG/ML SOLN COMPARISON:  None. FINDINGS: VASCULAR Aorta: Minimal plaque in the abdominal aorta without evidence aneurysm, dissection or stenosis. Celiac: Separate origin the common hepatic artery off of the abdominal aorta. Adjacent trunk supplies the splenic artery and left gastric artery. Branch vessels are normally patent. No active bleed or pseudoaneurysm in the celiac distribution. SMA: Normally patent. Visualized branch vessels are normally patent. No active bleed or pseudoaneurysm in the SMA distribution. Renals: Mild calcified plaque of proximal bilateral single renal arteries without evidence of significant stenosis. IMA: Normally patent. No active bleed or pseudoaneurysm in the IMA distribution. Inflow: Bilateral iliac arteries demonstrate mild tortuosity without evidence of stenosis or aneurysm. Proximal Outflow: Normally patent bilateral common femoral arteries and femoral bifurcations. Veins: Venous phase imaging demonstrates normal patency of visualized venous structures including mesenteric veins, splenic vein, portal vein, bilateral renal veins, IVC, iliac veins and common femoral veins. There is no evidence of extravasated intravascular contrast accumulation focally in the gastrointestinal tract on the venous phase of imaging. Review of the MIP images confirms the above findings. NON-VASCULAR Lower chest: No acute abnormality. Hepatobiliary: No focal liver  abnormality is seen. No gallstones, gallbladder wall thickening, or biliary dilatation. Pancreas: Unremarkable. No pancreatic ductal dilatation or surrounding inflammatory changes. Spleen: No splenic injury or perisplenic hematoma. Normal spleen size. Adrenals/Urinary Tract: Adrenal glands are unremarkable. Kidneys are normal, without renal calculi, focal lesion, or hydronephrosis. Benign-appearing adjacent simple cysts of the right kidney. Bladder is unremarkable. Stomach/Bowel: Bowel shows no evidence of obstruction, ileus or inflammation. No free intraperitoneal air identified. There is diffuse diverticulosis involving the entire colon without evidence acute diverticulitis or focal abscess. The appendix is normal. Lymphatic: No enlarged abdominal or pelvic lymph nodes. Reproductive: Status post hysterectomy. No adnexal masses. Other: No ascites. Small inguinal hernias are present bilaterally containing fat. Musculoskeletal: No acute or significant osseous findings. IMPRESSION: 1. No evidence of active bleeding into the gastrointestinal tract on arterial or venous phases of imaging by CTA. 2. No significant vascular abnormalities. No arterial pseudoaneurysms identified. No evidence of mesenteric arterial occlusive disease. Normal variant separate origin of the common hepatic artery off of the abdominal aorta. 3. Diffuse diverticulosis of the entire colon without evidence of diverticulitis or other inflammatory process. 4. Small bilateral inguinal hernias containing fat. Electronically Signed  By: Irish Lack M.D.   On: 08/25/2021 09:26    Scheduled Meds:  sodium chloride flush  3 mL Intravenous Q12H   Continuous Infusions:  sodium chloride     sodium chloride 75 mL/hr at 08/24/21 2217     LOS: 0 days   Time spent: 25 minutes.  Tyrone Nine, MD [facility] [email or web address] 08/25/2021, 2:07 PM

## 2021-08-25 NOTE — Progress Notes (Signed)
Patient has three dark color bowel movement. MD notified and started medicine as per ordered. Patient will be in NPO after after midnight. Will continue monitor.

## 2021-08-26 LAB — HEMOGLOBIN AND HEMATOCRIT, BLOOD
HCT: 24.1 % — ABNORMAL LOW (ref 36.0–46.0)
HCT: 22.2 % — ABNORMAL LOW (ref 36.0–46.0)
HCT: 22.6 % — ABNORMAL LOW (ref 36.0–46.0)
HCT: 22.6 % — ABNORMAL LOW (ref 36.0–46.0)
Hemoglobin: 7.6 g/dL — ABNORMAL LOW (ref 12.0–15.0)
Hemoglobin: 7 g/dL — ABNORMAL LOW (ref 12.0–15.0)
Hemoglobin: 7.1 g/dL — ABNORMAL LOW (ref 12.0–15.0)
Hemoglobin: 7.3 g/dL — ABNORMAL LOW (ref 12.0–15.0)

## 2021-08-26 LAB — COMPREHENSIVE METABOLIC PANEL
ALT: 10 U/L (ref 0–44)
AST: 18 U/L (ref 15–41)
Albumin: 2.8 g/dL — ABNORMAL LOW (ref 3.5–5.0)
Alkaline Phosphatase: 30 U/L — ABNORMAL LOW (ref 38–126)
Anion gap: 6 (ref 5–15)
BUN: 5 mg/dL — ABNORMAL LOW (ref 8–23)
CO2: 23 mmol/L (ref 22–32)
Calcium: 8.6 mg/dL — ABNORMAL LOW (ref 8.9–10.3)
Chloride: 108 mmol/L (ref 98–111)
Creatinine, Ser: 0.65 mg/dL (ref 0.44–1.00)
GFR, Estimated: >60 mL/min (ref >60)
Glucose, Bld: 114 mg/dL — ABNORMAL HIGH (ref 70–99)
Potassium: 3.5 mmol/L (ref 3.5–5.1)
Sodium: 137 mmol/L (ref 135–145)
Total Bilirubin: 0.1 mg/dL — ABNORMAL LOW (ref 0.3–1.2)
Total Protein: 5.6 g/dL — ABNORMAL LOW (ref 6.5–8.1)

## 2021-08-26 LAB — CBC
HCT: 22.4 % — ABNORMAL LOW (ref 36.0–46.0)
Hemoglobin: 7.2 g/dL — ABNORMAL LOW (ref 12.0–15.0)
MCH: 26.6 pg (ref 26.0–34.0)
MCHC: 32.1 g/dL (ref 30.0–36.0)
MCV: 82.7 fL (ref 80.0–100.0)
Platelets: 207 10*3/uL (ref 150–400)
RBC: 2.71 MIL/uL — ABNORMAL LOW (ref 3.87–5.11)
RDW: 15.3 % (ref 11.5–15.5)
WBC: 8.1 10*3/uL (ref 4.0–10.5)
nRBC: 0.2 % (ref 0.0–0.2)

## 2021-08-26 LAB — GLUCOSE, CAPILLARY: Glucose-Capillary: 138 mg/dL — ABNORMAL HIGH (ref 70–99)

## 2021-08-26 LAB — PREPARE RBC (CROSSMATCH)

## 2021-08-26 MED ORDER — PANTOPRAZOLE SODIUM 40 MG IV SOLR
40.0000 mg | Freq: Every day | INTRAVENOUS | Status: DC
  Administered 2021-08-26 – 2021-08-28 (×3): 40 mg via INTRAVENOUS
  Filled 2021-08-26 (×3): qty 40

## 2021-08-26 MED ORDER — LACTATED RINGERS IV SOLN: INTRAVENOUS | Status: DC

## 2021-08-26 MED ORDER — SODIUM CHLORIDE 0.9% IV SOLUTION: Freq: Once | INTRAVENOUS | Status: AC

## 2021-08-26 NOTE — Progress Notes (Signed)
TELE applied and confirmed.

## 2021-08-26 NOTE — Progress Notes (Signed)
Patient noted to have 4 total bloody stools witness by RN today. After recent BM, pt c/o of dizziness, lightheaded and feeling hot, no perspiration noted, at this time. VSS, CBG of 138. MD paged of condition.   Nursing POC: Bed alarm activated, frequent round, monitor stools and labs.

## 2021-08-26 NOTE — Progress Notes (Signed)
PROGRESS NOTE  Doris Boyd  UXL:[id number] DOB: Sep 08, 1946 DOA: 08/24/2021 PCP: Marden Noble, MD   Brief Narrative: Doris Boyd is a 75 y.o. female with a history of pandiverticulosis who takes no medications and presented to the ED 9/2 with 3 days of painless red blood in stool turning more dark over time. In the ED she was orthostatic with hgb 8.1g/dl (from 53G/UY on 4/03), +FOBT, so GI was consulted, 1u PRBCs transfused, and patient admitted. Bleeding initially seemed to improve and CTA showed no source of bleeding. Unfortunately, she has again developed frequent stools characterized as dark and red bloody. PPI was started empirically and pt made NPO.  Assessment & Plan: Principal Problem:   GI bleed Active Problems:   Acute blood loss anemia  Acute blood loss anemia due to lower GI bleeding, presumed diverticular bleed: CTA confirms findings from colonoscopy 2018 of widespread diverticulosis, negative for bleeding source or vascular malformation this AM. - Dark character suggests more proximal source, though BUN is low/normal not suggestive of UGIB, suspect proximal diverticular bleed. DC PPI gtt. - Repeat CBC pending this morning, though confirmed T&S still good and pt consents to blood products.  - Appreciate GI following, currently hoping to avoid colonoscopy.  - Serial H/H to continue. s/p 1u PRBCs, hgb 8.1 >> 8.4. Transfusion threshold is 7g/dl. - Continue IVF while NPO.  Obesity: Estimated body mass index is 31.83 kg/m as calculated from the following:   Height as of 06/17/17: 5\' 2"  (1.575 m).   Weight as of 06/17/17: 78.9 kg.  DVT prophylaxis: SCDs Code Status: Full Family Communication: None at bedside Disposition Plan:  Status is: Inpatient  The patient will require care spanning > 2 midnights and should be moved to inpatient because:  Acute, transfusion-dependent blood loss anemia with need for continued monitoring  Dispo: The patient is from: Home               Anticipated d/c is to: Home              Patient currently is not medically stable to d/c.  Consultants:  06/19/17 GI  Procedures:  None  Antimicrobials: None   Subjective: Continues to have painless dark stool with red blood admixed. No nausea or vomiting. Last BM was 2 hours ago. No chest pain, dyspnea, lightheadedness.   Objective: BP (!) 114/57 (BP Location: Left Arm)   Pulse 85   Temp 98.6 F (37 C) (Oral)   Resp 18   SpO2 98%   Gen: 75 y.o. female in no distress Pulm: Nonlabored breathing room air. Clear. CV: Regular rate and rhythm. No murmur, rub, or gallop. No JVD, no dependent edema. GI: Abdomen soft, non-tender, non-distended, with normoactive bowel sounds.  Ext: Warm, no deformities Skin: No rashes, lesions or ulcers on visualized skin. Neuro: Alert and oriented. No focal neurological deficits. Psych: Judgement and insight appear fair. Mood euthymic & affect congruent. Behavior is appropriate.    Data Reviewed: I have personally reviewed following labs and imaging studies  CBC: Recent Labs  Lab 08/24/21 1450 08/24/21 2151 08/25/21 0228 08/25/21 1125 08/25/21 1844 08/26/21 0046 08/26/21 0647 08/26/21 0731  WBC 9.1  --  9.9  --   --   --   --  8.1  NEUTROABS 5.0  --   --   --   --   --   --   --   HGB 8.1*   < > 8.4* 8.9* 9.0* 7.6* 7.0* 7.2*  HCT 25.8*   < >  26.1* 27.5* 28.2* 24.1* 22.2* 22.4*  MCV 83.2  --  81.8  --   --   --   --  82.7  PLT 208  --  211  --   --   --   --  207   < > = values in this interval not displayed.   Basic Metabolic Panel: Recent Labs  Lab 08/24/21 1450 08/25/21 0228 08/25/21 1844  NA 139 139  --   K 4.3 4.2  --   CL 109 107  --   CO2 24 24  --   GLUCOSE 101* 101*  --   BUN 14 12 8   CREATININE 0.62 0.53  --   CALCIUM 9.2 9.1  --    GFR: CrCl cannot be calculated (Unknown ideal weight.). Liver Function Tests: Recent Labs  Lab 08/24/21 1450  AST 17  ALT 10  ALKPHOS 33*  BILITOT 0.6  PROT 6.2*  ALBUMIN 3.2*    No results for input(s): LIPASE, AMYLASE in the last 168 hours. No results for input(s): AMMONIA in the last 168 hours. Coagulation Profile: Recent Labs  Lab 08/24/21 1450  INR 1.1   Cardiac Enzymes: No results for input(s): CKTOTAL, CKMB, CKMBINDEX, TROPONINI in the last 168 hours. BNP (last 3 results) No results for input(s): PROBNP in the last 8760 hours. HbA1C: No results for input(s): HGBA1C in the last 72 hours. CBG: No results for input(s): GLUCAP in the last 168 hours. Lipid Profile: No results for input(s): CHOL, HDL, LDLCALC, TRIG, CHOLHDL, LDLDIRECT in the last 72 hours. Thyroid Function Tests: No results for input(s): TSH, T4TOTAL, FREET4, T3FREE, THYROIDAB in the last 72 hours. Anemia Panel: No results for input(s): VITAMINB12, FOLATE, FERRITIN, TIBC, IRON, RETICCTPCT in the last 72 hours. Urine analysis:    Component Value Date/Time   COLORURINE YELLOW 08/24/2021 1932   APPEARANCEUR CLEAR 08/24/2021 1932   LABSPEC 1.020 08/24/2021 1932   PHURINE 5.0 08/24/2021 1932   GLUCOSEU NEGATIVE 08/24/2021 1932   HGBUR MODERATE (A) 08/24/2021 1932   BILIRUBINUR NEGATIVE 08/24/2021 1932   KETONESUR 5 (A) 08/24/2021 1932   PROTEINUR NEGATIVE 08/24/2021 1932   NITRITE NEGATIVE 08/24/2021 1932   LEUKOCYTESUR NEGATIVE 08/24/2021 1932   Recent Results (from the past 240 hour(s))  Resp Panel by RT-PCR (Flu A&B, Covid) Nasopharyngeal Swab     Status: None   Collection Time: 08/24/21  1:10 PM   Specimen: Nasopharyngeal Swab; Nasopharyngeal(NP) swabs in vial transport medium  Result Value Ref Range Status   SARS Coronavirus 2 by RT PCR NEGATIVE NEGATIVE Final    Comment: (NOTE) SARS-CoV-2 target nucleic acids are NOT DETECTED.  The SARS-CoV-2 RNA is generally detectable in upper respiratory specimens during the acute phase of infection. The lowest concentration of SARS-CoV-2 viral copies this assay can detect is 138 copies/mL. A negative result does not preclude  SARS-Cov-2 infection and should not be used as the sole basis for treatment or other patient management decisions. A negative result may occur with  improper specimen collection/handling, submission of specimen other than nasopharyngeal swab, presence of viral mutation(s) within the areas targeted by this assay, and inadequate number of viral copies(<138 copies/mL). A negative result must be combined with clinical observations, patient history, and epidemiological information. The expected result is Negative.  Fact Sheet for Patients:  10/24/21  Fact Sheet for Healthcare Providers:  BloggerCourse.com  This test is no t yet approved or cleared by the [place] FDA and  has been authorized for detection and/or diagnosis  of SARS-CoV-2 by FDA under an Emergency Use Authorization (EUA). This EUA will remain  in effect (meaning this test can be used) for the duration of the COVID-19 declaration under Section 564(b)(1) of the Act, 21 U.S.C.section 360bbb-3(b)(1), unless the authorization is terminated  or revoked sooner.       Influenza A by PCR NEGATIVE NEGATIVE Final   Influenza B by PCR NEGATIVE NEGATIVE Final    Comment: (NOTE) The Xpert Xpress SARS-CoV-2/FLU/RSV plus assay is intended as an aid in the diagnosis of influenza from Nasopharyngeal swab specimens and should not be used as a sole basis for treatment. Nasal washings and aspirates are unacceptable for Xpert Xpress SARS-CoV-2/FLU/RSV testing.  Fact Sheet for Patients: BloggerCourse.comhttps://www.fda.gov/media/152166/download  Fact Sheet for Healthcare Providers: SeriousBroker.ithttps://www.fda.gov/media/152162/download  This test is not yet approved or cleared by the [place] FDA and has been authorized for detection and/or diagnosis of SARS-CoV-2 by FDA under an Emergency Use Authorization (EUA). This EUA will remain in effect (meaning this test can be used) for the duration of  the COVID-19 declaration under Section 564(b)(1) of the Act, 21 U.S.C. section 360bbb-3(b)(1), unless the authorization is terminated or revoked.  Performed at [facility], [address] [place], [place] [address]       Radiology Studies: DG CHEST PORT 1 VIEW  Result Date: 08/24/2021 CLINICAL DATA:  GI bleed.  Dizziness. EXAM: PORTABLE CHEST 1 VIEW COMPARISON:  02/05/2016 FINDINGS: The cardiomediastinal contours are normal. Minimal elevation of right hemidiaphragm which is similar to prior. Pulmonary vasculature is normal. No consolidation, pleural effusion, or pneumothorax. No acute osseous abnormalities are seen. IMPRESSION: No acute chest findings. Electronically Signed   By: Narda RutherfordMelanie  Sanford M.D.   On: 08/24/2021 16:48   CT Angio Abd/Pel w/ and/or w/o  Result Date: 08/25/2021 CLINICAL DATA:  GI bleed with passage of dark red bowel movements over the last several days. EXAM: CT ANGIOGRAPHY ABDOMEN AND PELVIS WITH CONTRAST AND WITHOUT CONTRAST TECHNIQUE: Multidetector CT imaging of the abdomen and pelvis was performed using the standard protocol during bolus administration of intravenous contrast. Multiplanar reconstructed images and MIPs were obtained and reviewed to evaluate the vascular anatomy. CONTRAST:  100mL OMNIPAQUE IOHEXOL 350 MG/ML SOLN COMPARISON:  None. FINDINGS: VASCULAR Aorta: Minimal plaque in the abdominal aorta without evidence aneurysm, dissection or stenosis. Celiac: Separate origin the common hepatic artery off of the abdominal aorta. Adjacent trunk supplies the splenic artery and left gastric artery. Branch vessels are normally patent. No active bleed or pseudoaneurysm in the celiac distribution. SMA: Normally patent. Visualized branch vessels are normally patent. No active bleed or pseudoaneurysm in the SMA distribution. Renals: Mild calcified plaque of proximal bilateral single renal arteries without evidence of significant stenosis. IMA: Normally patent. No active  bleed or pseudoaneurysm in the IMA distribution. Inflow: Bilateral iliac arteries demonstrate mild tortuosity without evidence of stenosis or aneurysm. Proximal Outflow: Normally patent bilateral common femoral arteries and femoral bifurcations. Veins: Venous phase imaging demonstrates normal patency of visualized venous structures including mesenteric veins, splenic vein, portal vein, bilateral renal veins, IVC, iliac veins and common femoral veins. There is no evidence of extravasated intravascular contrast accumulation focally in the gastrointestinal tract on the venous phase of imaging. Review of the MIP images confirms the above findings. NON-VASCULAR Lower chest: No acute abnormality. Hepatobiliary: No focal liver abnormality is seen. No gallstones, gallbladder wall thickening, or biliary dilatation. Pancreas: Unremarkable. No pancreatic ductal dilatation or surrounding inflammatory changes. Spleen: No splenic injury or perisplenic hematoma. Normal spleen  size. Adrenals/Urinary Tract: Adrenal glands are unremarkable. Kidneys are normal, without renal calculi, focal lesion, or hydronephrosis. Benign-appearing adjacent simple cysts of the right kidney. Bladder is unremarkable. Stomach/Bowel: Bowel shows no evidence of obstruction, ileus or inflammation. No free intraperitoneal air identified. There is diffuse diverticulosis involving the entire colon without evidence acute diverticulitis or focal abscess. The appendix is normal. Lymphatic: No enlarged abdominal or pelvic lymph nodes. Reproductive: Status post hysterectomy. No adnexal masses. Other: No ascites. Small inguinal hernias are present bilaterally containing fat. Musculoskeletal: No acute or significant osseous findings. IMPRESSION: 1. No evidence of active bleeding into the gastrointestinal tract on arterial or venous phases of imaging by CTA. 2. No significant vascular abnormalities. No arterial pseudoaneurysms identified. No evidence of mesenteric  arterial occlusive disease. Normal variant separate origin of the common hepatic artery off of the abdominal aorta. 3. Diffuse diverticulosis of the entire colon without evidence of diverticulitis or other inflammatory process. 4. Small bilateral inguinal hernias containing fat. Electronically Signed   By: Irish Lack M.D.   On: 08/25/2021 09:26    Scheduled Meds:  [START ON 08/29/2021] pantoprazole  40 mg Intravenous Q12H   sodium chloride flush  3 mL Intravenous Q12H   Continuous Infusions:  lactated ringers 75 mL/hr at 08/26/21 0142   pantoprazole 8 mg/hr (08/26/21 0329)     LOS: 1 day   Time spent: 35 minutes.  Tyrone Nine, MD [facility] [email or web address] 08/26/2021, 8:23 AM

## 2021-08-26 NOTE — Plan of Care (Signed)
  Problem: Education: Goal: Knowledge of General Education information will improve Description Including pain rating scale, medication(s)/side effects and non-pharmacologic comfort measures Outcome: Progressing   

## 2021-08-26 NOTE — Progress Notes (Addendum)
HOSPITAL MEDICINE OVERNIGHT EVENT NOTE    Notified by nursing the patient has had approximately 5 episodes of moderate amounts of dark red hematochezia.  Chart reviewed, patient has been evaluated for the past several days by gastroenterology for possible lower gastrointestinal bleeding and is being managed conservatively without colonoscopy.  Patient underwent CT angiogram of the abdomen and pelvis on 9/3 which did not identify a specific source of active bleeding.  Patient remains n.p.o.  Initial plan was to advance diet if patient did not rebleed but unfortunately patient's ongoing bleeding will need to keep patient n.p.o.  We will order additional serial hemoglobin and hematocrit levels and transfuse if hemoglobin is less than 7.  Will order gentle intravenous hydration due to prolonged period of time NPO.  Day team will likely need to coordinate with gastroenterology in the morning for reconsideration of endoscopic evaluation.  Marinda Elk  MD [facility]   ADDENDUM 9/4 1:10AM  Repeat hemoglobin found to be 7.6, down from 9.0.  Drop is concerning for ongoing bleeding.  We will continue to cycle hemoglobin and hematocrit levels and transfuse if hemoglobin drops below 7.  Continuing PPI.  Continue to keep patient NPO.  Continuing intravenous fluids.  We will coordinate with gastroenterology in the morning for consideration of endoscopic evaluation.  Deno Lunger Georgie Eduardo

## 2021-08-26 NOTE — Progress Notes (Signed)
Subjective: Darker stool passage yesterday and earlier today. No abdominal pain.  Objective: Vital signs in last 24 hours: Temp:  [98 F (36.7 C)-98.8 F (37.1 C)] 98 F (36.7 C) (09/04 1152) Pulse Rate:  [80-96] 80 (09/04 1152) Resp:  [16-18] 18 (09/04 1152) BP: (108-146)/(54-74) 108/54 (09/04 1152) SpO2:  [98 %-100 %] 99 % (09/04 1152) Weight change:  Last BM Date: 08/25/21  PE: GEN:  NAD  Lab Results: CBC    Component Value Date/Time   WBC 8.1 08/26/2021 0731   RBC 2.71 (L) 08/26/2021 0731   HGB 7.2 (L) 08/26/2021 0731   HCT 22.4 (L) 08/26/2021 0731   PLT 207 08/26/2021 0731   MCV 82.7 08/26/2021 0731   MCH 26.6 08/26/2021 0731   MCHC 32.1 08/26/2021 0731   RDW 15.3 08/26/2021 0731   LYMPHSABS 3.1 08/24/2021 1450   MONOABS 0.7 08/24/2021 1450   EOSABS 0.2 08/24/2021 1450   BASOSABS 0.0 08/24/2021 1450  CMP     Component Value Date/Time   NA 137 08/26/2021 0647   K 3.5 08/26/2021 0647   CL 108 08/26/2021 0647   CO2 23 08/26/2021 0647   GLUCOSE 114 (H) 08/26/2021 0647   BUN 5 (L) 08/26/2021 0647   CREATININE 0.65 08/26/2021 0647   CALCIUM 8.6 (L) 08/26/2021 0647   PROT 5.6 (L) 08/26/2021 0647   ALBUMIN 2.8 (L) 08/26/2021 0647   AST 18 08/26/2021 0647   ALT 10 08/26/2021 0647   ALKPHOS 30 (L) 08/26/2021 0647   BILITOT 0.1 (L) 08/26/2021 0647   GFRNONAA >60 08/26/2021 5465   Assessment:   Painless hematochezia, likely from colonic diverticulosis, CTA negative for active bleeding. Suspect dark blood passage is most likely old blood, and suspect interval decrease in Hgb is most likely from re-equilibration.  I do not suspect ongoing GI bleeding.  Acute blood loss anemia.  Plan:   Volume repletion.  Serial CBCs, transfuse as needed.  Restart diet; will begin with full liquids. [facility] will follow.   Freddy Jaksch 08/26/2021, 1:29 PM   Cell [phone] If no answer or after 5 PM call [phone]

## 2021-08-27 ENCOUNTER — Inpatient Hospital Stay (HOSPITAL_COMMUNITY): Payer: PPO

## 2021-08-27 LAB — HEMOGLOBIN AND HEMATOCRIT, BLOOD
HCT: 24 % — ABNORMAL LOW (ref 36.0–46.0)
HCT: 20.6 % — ABNORMAL LOW (ref 36.0–46.0)
HCT: 23.6 % — ABNORMAL LOW (ref 36.0–46.0)
Hemoglobin: 7.9 g/dL — ABNORMAL LOW (ref 12.0–15.0)
Hemoglobin: 6.6 g/dL — CL (ref 12.0–15.0)
Hemoglobin: 7.7 g/dL — ABNORMAL LOW (ref 12.0–15.0)

## 2021-08-27 LAB — PREPARE RBC (CROSSMATCH)

## 2021-08-27 MED ORDER — LACTATED RINGERS IV SOLN: INTRAVENOUS | Status: DC

## 2021-08-27 MED ORDER — MIDAZOLAM HCL 2 MG/2ML IJ SOLN
INTRAMUSCULAR | Status: AC
  Filled 2021-08-27: qty 2

## 2021-08-27 MED ORDER — FENTANYL CITRATE (PF) 100 MCG/2ML IJ SOLN
INTRAMUSCULAR | Status: AC | PRN
  Administered 2021-08-27: 50 ug via INTRAVENOUS
  Administered 2021-08-28 (×2): 25 ug via INTRAVENOUS

## 2021-08-27 MED ORDER — FENTANYL CITRATE (PF) 100 MCG/2ML IJ SOLN
INTRAMUSCULAR | Status: AC
  Filled 2021-08-27: qty 2

## 2021-08-27 MED ORDER — IOHEXOL 350 MG/ML SOLN
100.0000 mL | Freq: Once | INTRAVENOUS | Status: AC | PRN
  Administered 2021-08-27: 100 mL via INTRAVENOUS

## 2021-08-27 MED ORDER — LIDOCAINE HCL 1 % IJ SOLN
INTRAMUSCULAR | Status: AC
  Filled 2021-08-27: qty 20

## 2021-08-27 MED ORDER — LIDOCAINE HCL (PF) 1 % IJ SOLN
INTRAMUSCULAR | Status: AC | PRN
  Administered 2021-08-27: 5 mL via INTRADERMAL
  Administered 2021-08-27: 10 mL via INTRADERMAL

## 2021-08-27 MED ORDER — MIDAZOLAM HCL 2 MG/2ML IJ SOLN
INTRAMUSCULAR | Status: AC | PRN
  Administered 2021-08-27: 1 mg via INTRAVENOUS
  Administered 2021-08-28 (×4): 0.5 mg via INTRAVENOUS

## 2021-08-27 MED ORDER — SODIUM CHLORIDE 0.9% IV SOLUTION: Freq: Once | INTRAVENOUS | Status: AC

## 2021-08-27 MED ORDER — SODIUM CHLORIDE 0.9% IV SOLUTION: Freq: Once | INTRAVENOUS | Status: DC

## 2021-08-27 NOTE — Progress Notes (Signed)
Subjective: No further bleeding.  Objective: Vital signs in last 24 hours: Temp:  [97.7 F (36.5 C)-98.6 F (37 C)] 97.7 F (36.5 C) (09/05 1217) Pulse Rate:  [78-102] 102 (09/05 1217) Resp:  [16-18] 16 (09/05 1217) BP: (111-153)/(54-68) 118/63 (09/05 1217) SpO2:  [98 %-100 %] 100 % (09/05 1217) Weight change:  Last BM Date: 08/26/21  PE: GEN:  NAD  Lab Results: CBC    Component Value Date/Time   WBC 8.1 08/26/2021 0731   RBC 2.71 (L) 08/26/2021 0731   HGB 6.6 (LL) 08/27/2021 1330   HCT 20.6 (L) 08/27/2021 1330   PLT 207 08/26/2021 0731   MCV 82.7 08/26/2021 0731   MCH 26.6 08/26/2021 0731   MCHC 32.1 08/26/2021 0731   RDW 15.3 08/26/2021 0731   LYMPHSABS 3.1 08/24/2021 1450   MONOABS 0.7 08/24/2021 1450   EOSABS 0.2 08/24/2021 1450   BASOSABS 0.0 08/24/2021 1450  CMP     Component Value Date/Time   NA 137 08/26/2021 0647   K 3.5 08/26/2021 0647   CL 108 08/26/2021 0647   CO2 23 08/26/2021 0647   GLUCOSE 114 (H) 08/26/2021 0647   BUN 5 (L) 08/26/2021 0647   CREATININE 0.65 08/26/2021 0647   CALCIUM 8.6 (L) 08/26/2021 0647   PROT 5.6 (L) 08/26/2021 0647   ALBUMIN 2.8 (L) 08/26/2021 0647   AST 18 08/26/2021 0647   ALT 10 08/26/2021 0647   ALKPHOS 30 (L) 08/26/2021 0647   BILITOT 0.1 (L) 08/26/2021 0647   GFRNONAA >60 08/26/2021 8850   Assessment:   Painless hematochezia, likely from colonic diverticulosis, CTA negative for active bleeding. Suspect dark blood passage is most likely old blood, and suspect interval decrease in Hgb is most likely from re-equilibration.  I do not suspect ongoing GI bleeding.  Acute blood loss anemia.  Plan:   Once a day CBC should suffice. Advance diet. If stable Hgb tomorrow, likely d/c home tomorrow ok from GI perspective. [facility] will follow.   Freddy Jaksch 08/27/2021, 2:51 PM   Cell [phone] If no answer or after 5 PM call [phone]

## 2021-08-27 NOTE — Progress Notes (Signed)
Interventional Radiology Progress Note  Patient seen in IR for mesenteric arteriography with possible embolization. There has also been inability to establish second IV access and blood being administered through current IV. Will use [place] to place second IV access.  Risks and benefits of arteriography were discussed with the patient and her husband including, but not limited to bleeding, infection, vascular injury or contrast induced renal failure.  This interventional procedure involves the use of X-rays and because of the nature of the planned procedure, it is possible that we will have prolonged use of X-ray fluoroscopy.  Potential radiation risks to you include (but are not limited to) the following: - A slightly elevated risk for cancer  several years later in life. This risk is typically less than 0.5% percent. This risk is low in comparison to the normal incidence of human cancer, which is 33% for women and 50% for men according to the American Cancer Society. - Radiation induced injury can include skin redness, resembling a rash, tissue breakdown / ulcers and hair loss (which can be temporary or permanent).   The likelihood of either of these occurring depends on the difficulty of the procedure and whether you are sensitive to radiation due to previous procedures, disease, or genetic conditions.   IF your procedure requires a prolonged use of radiation, you will be notified and given written instructions for further action.  It is your responsibility to monitor the irradiated area for the 2 weeks following the procedure and to notify your physician if you are concerned that you have suffered a radiation induced injury.    All of the patient's and her husband's questions were answered, patient is agreeable to proceed. Consent signed by husband due to patient history of mild dementia.  Consent signed and in chart.   Jodi Marble. Fredia Sorrow, M.D Pager:  [phone]

## 2021-08-27 NOTE — Progress Notes (Signed)
Report given to oncoming shift to notify CT once 18G AC established by IV team.

## 2021-08-27 NOTE — Significant Event (Addendum)
I was asked by my colleague Dr. Dimas Alexandria to follow-up on the CT angiogram of the abdomen for active GI bleed.  Results show that patient is actively bleeding from mid transverse colon.  Last hemoglobin noted to be around 7.7 patient's heart rate is 104 bpm with blood pressure systolic in the 120s.  Has not had any further episodes of bowel movement since last.  Discussed with Dr. Bosie Clos on-call gastroenterologist who advised calling interventional radiologist for possible embolization.  Addendum -discussed with Dr. Fredia Sorrow on-call interventional radiologist about patient and patient's CT scan findings.  I will be ordering 1 unit of PRBC transfusion.  Patient's nurse updated.  Midge Minium

## 2021-08-27 NOTE — Progress Notes (Addendum)
PIV infiltrate during the last 40cc of blood transfusion. PIV removed. VSS. MD notified of issue. Patient with difficult PIV stick. IV team consult for new PIV.    PIV consult for STAT 18G for stat CTA. Spoke to jeftt in CT and will prioritize once 18G AC established.

## 2021-08-27 NOTE — Progress Notes (Signed)
Interventional Radiology Progress Note  Called by Dr. Toniann Fail due to positive CTA result tonight in setting of recurrent lower GI bleeding. CTA at 9 PM shows arterial phase extravasation at level of distal transverse colon, likely due to a diverticular bleed from a distal SMA branch. Will mobilize IR call team emergently and speak to Mrs. Berland about arteriography tonight with possible embolization.  Jodi Marble. Fredia Sorrow, M.D Pager:  [phone]

## 2021-08-27 NOTE — Progress Notes (Signed)
PROGRESS NOTE  Doris Boyd  SWN:[id number] DOB: 10/19/1946 DOA: 08/24/2021 PCP: Marden Noble, MD   Brief Narrative: Doris Boyd is a 75 y.o. female with a history of pandiverticulosis who takes no medications and presented to the ED 9/2 with 3 days of painless red blood in stool turning more dark over time. In the ED she was orthostatic with hgb 8.1g/dl (from 93G/HW on 2/99), +FOBT, so GI was consulted, 1u PRBCs transfused, and patient admitted. Bleeding initially seemed to improve and CTA showed no source of bleeding. Unfortunately, she has again developed frequent stools characterized as dark and red bloody. PPI was started empirically and pt made NPO. An additional unit of blood was given 9/4.  Assessment & Plan: Principal Problem:   GI bleed Active Problems:   Acute blood loss anemia  Acute blood loss anemia due to lower GI bleeding, presumed diverticular bleed: CTA confirms findings from colonoscopy 2018 of widespread diverticulosis, negative for bleeding source or vascular malformation  - Hgb dropped to 7.1g/dl in setting of ongoing dark stools with admixed red blood on 9/4 and onset of dizziness, so gave 1u PRBCs. H/H up to 7.9g/dl this AM. Will continue trending. s/p 1u PRBCs 9/2 as well. - Renal function is good, so if bleeding appears red/active, would check stat CTA +/- IR if positive.  - Appreciate GI following, currently hoping to avoid colonoscopy.   Obesity: Estimated body mass index is 31.83 kg/m as calculated from the following:   Height as of 06/17/17: 5\' 2"  (1.575 m).   Weight as of 06/17/17: 78.9 kg.  DVT prophylaxis: SCDs Code Status: Full Family Communication: None at bedside. Husband by phone 9/4. Disposition Plan:  Status is: Inpatient  The patient will require care spanning > 2 midnights and should be moved to inpatient because:  Acute, transfusion-dependent blood loss anemia with need for continued monitoring  Dispo: The patient is from: Home               Anticipated d/c is to: Home Needs to be without evidence of bleeding and stable hgb x24 hours at the least to be discharged.               Patient currently is not medically stable to d/c.  Consultants:  [facility]  Procedures:  1u PRBC 9/2 1u PRBC 9/4  Antimicrobials: None   Subjective: Got more dizzy last night with continued dark stools primarily with suggestion of reddish discoloration as well. These have continued over a couple times in past 24 hours. Dizziness improved with blood last night. No abd pain, N/V, chest pain or dyspnea. VSS.  Objective: BP 122/61 (BP Location: Left Arm)   Pulse (!) 102   Temp 98.1 F (36.7 C) (Oral)   Resp 16   SpO2 99%   Gen: Tired-appearing female in no distress Pulm: Nonlabored breathing room air. Clear. CV: Regular rate and rhythm. No murmur, rub, or gallop. No JVD, no dependent edema. GI: Abdomen soft, non-tender, non-distended, with normoactive bowel sounds.  Ext: Warm, no deformities Skin: No rashes, lesions or ulcers on visualized skin. Neuro: Alert and oriented. No focal neurological deficits. Psych: Judgement and insight appear fair. Mood euthymic & affect congruent. Behavior is appropriate.    Data Reviewed: I have personally reviewed following labs and imaging studies  CBC: Recent Labs  Lab 08/24/21 1450 08/24/21 2151 08/25/21 0228 08/25/21 1125 08/26/21 0647 08/26/21 0731 08/26/21 1311 08/26/21 2038 08/27/21 0253  WBC 9.1  --  9.9  --   --  8.1  --   --   --   NEUTROABS 5.0  --   --   --   --   --   --   --   --   HGB 8.1*   < > 8.4*   < > 7.0* 7.2* 7.1* 7.3* 7.9*  HCT 25.8*   < > 26.1*   < > 22.2* 22.4* 22.6* 22.6* 24.0*  MCV 83.2  --  81.8  --   --  82.7  --   --   --   PLT 208  --  211  --   --  207  --   --   --    < > = values in this interval not displayed.   Basic Metabolic Panel: Recent Labs  Lab 08/24/21 1450 08/25/21 0228 08/25/21 1844 08/26/21 0647  NA 139 139  --  137  K 4.3 4.2  --  3.5  CL 109  107  --  108  CO2 24 24  --  23  GLUCOSE 101* 101*  --  114*  BUN 14 12 8  5*  CREATININE 0.62 0.53  --  0.65  CALCIUM 9.2 9.1  --  8.6*   GFR: CrCl cannot be calculated (Unknown ideal weight.). Liver Function Tests: Recent Labs  Lab 08/24/21 1450 08/26/21 0647  AST 17 18  ALT 10 10  ALKPHOS 33* 30*  BILITOT 0.6 0.1*  PROT 6.2* 5.6*  ALBUMIN 3.2* 2.8*   No results for input(s): LIPASE, AMYLASE in the last 168 hours. No results for input(s): AMMONIA in the last 168 hours. Coagulation Profile: Recent Labs  Lab 08/24/21 1450  INR 1.1   Cardiac Enzymes: No results for input(s): CKTOTAL, CKMB, CKMBINDEX, TROPONINI in the last 168 hours. BNP (last 3 results) No results for input(s): PROBNP in the last 8760 hours. HbA1C: No results for input(s): HGBA1C in the last 72 hours. CBG: Recent Labs  Lab 08/26/21 1808  GLUCAP 138*   Lipid Profile: No results for input(s): CHOL, HDL, LDLCALC, TRIG, CHOLHDL, LDLDIRECT in the last 72 hours. Thyroid Function Tests: No results for input(s): TSH, T4TOTAL, FREET4, T3FREE, THYROIDAB in the last 72 hours. Anemia Panel: No results for input(s): VITAMINB12, FOLATE, FERRITIN, TIBC, IRON, RETICCTPCT in the last 72 hours. Urine analysis:    Component Value Date/Time   COLORURINE YELLOW 08/24/2021 1932   APPEARANCEUR CLEAR 08/24/2021 1932   LABSPEC 1.020 08/24/2021 1932   PHURINE 5.0 08/24/2021 1932   GLUCOSEU NEGATIVE 08/24/2021 1932   HGBUR MODERATE (A) 08/24/2021 1932   BILIRUBINUR NEGATIVE 08/24/2021 1932   KETONESUR 5 (A) 08/24/2021 1932   PROTEINUR NEGATIVE 08/24/2021 1932   NITRITE NEGATIVE 08/24/2021 1932   LEUKOCYTESUR NEGATIVE 08/24/2021 1932   Recent Results (from the past 240 hour(s))  Resp Panel by RT-PCR (Flu A&B, Covid) Nasopharyngeal Swab     Status: None   Collection Time: 08/24/21  1:10 PM   Specimen: Nasopharyngeal Swab; Nasopharyngeal(NP) swabs in vial transport medium  Result Value Ref Range Status   SARS  Coronavirus 2 by RT PCR NEGATIVE NEGATIVE Final    Comment: (NOTE) SARS-CoV-2 target nucleic acids are NOT DETECTED.  The SARS-CoV-2 RNA is generally detectable in upper respiratory specimens during the acute phase of infection. The lowest concentration of SARS-CoV-2 viral copies this assay can detect is 138 copies/mL. A negative result does not preclude SARS-Cov-2 infection and should not be used as the sole basis for treatment or other patient management decisions. A negative result  may occur with  improper specimen collection/handling, submission of specimen other than nasopharyngeal swab, presence of viral mutation(s) within the areas targeted by this assay, and inadequate number of viral copies(<138 copies/mL). A negative result must be combined with clinical observations, patient history, and epidemiological information. The expected result is Negative.  Fact Sheet for Patients:  BloggerCourse.com  Fact Sheet for Healthcare Providers:  SeriousBroker.it  This test is no t yet approved or cleared by the [place] FDA and  has been authorized for detection and/or diagnosis of SARS-CoV-2 by FDA under an Emergency Use Authorization (EUA). This EUA will remain  in effect (meaning this test can be used) for the duration of the COVID-19 declaration under Section 564(b)(1) of the Act, 21 U.S.C.section 360bbb-3(b)(1), unless the authorization is terminated  or revoked sooner.       Influenza A by PCR NEGATIVE NEGATIVE Final   Influenza B by PCR NEGATIVE NEGATIVE Final    Comment: (NOTE) The Xpert Xpress SARS-CoV-2/FLU/RSV plus assay is intended as an aid in the diagnosis of influenza from Nasopharyngeal swab specimens and should not be used as a sole basis for treatment. Nasal washings and aspirates are unacceptable for Xpert Xpress SARS-CoV-2/FLU/RSV testing.  Fact Sheet for  Patients: BloggerCourse.com  Fact Sheet for Healthcare Providers: SeriousBroker.it  This test is not yet approved or cleared by the [place] FDA and has been authorized for detection and/or diagnosis of SARS-CoV-2 by FDA under an Emergency Use Authorization (EUA). This EUA will remain in effect (meaning this test can be used) for the duration of the COVID-19 declaration under Section 564(b)(1) of the Act, 21 U.S.C. section 360bbb-3(b)(1), unless the authorization is terminated or revoked.  Performed at [facility], [address], [place], [place] [address]       Radiology Studies: No results found.  Scheduled Meds:  pantoprazole  40 mg Intravenous Daily   sodium chloride flush  3 mL Intravenous Q12H   Continuous Infusions:  lactated ringers       LOS: 2 days   Time spent: 35 minutes.  Tyrone Nine, MD [facility] [email or web address] 08/27/2021, 12:00 PM

## 2021-08-28 HISTORY — PX: IR US GUIDE VASC ACCESS RIGHT: IMG2390

## 2021-08-28 HISTORY — PX: IR EMBO ART  VEN HEMORR LYMPH EXTRAV  INC GUIDE ROADMAPPING: IMG5450

## 2021-08-28 HISTORY — PX: IR ANGIOGRAM FOLLOW UP STUDY: IMG697

## 2021-08-28 HISTORY — PX: IR ANGIOGRAM VISCERAL SELECTIVE: IMG657

## 2021-08-28 HISTORY — PX: IR RADIOLOGY PERIPHERAL GUIDED IV START: IMG5598

## 2021-08-28 HISTORY — PX: IR ANGIOGRAM SELECTIVE EACH ADDITIONAL VESSEL: IMG667

## 2021-08-28 LAB — TYPE AND SCREEN
ABO/RH(D): O POS
Antibody Screen: NEGATIVE
Unit division: 0
Unit division: 0
Unit division: 0
Unit division: 0

## 2021-08-28 LAB — CBC
HCT: 23.3 % — ABNORMAL LOW (ref 36.0–46.0)
Hemoglobin: 7.7 g/dL — ABNORMAL LOW (ref 12.0–15.0)
MCH: 27.3 pg (ref 26.0–34.0)
MCHC: 33 g/dL (ref 30.0–36.0)
MCV: 82.6 fL (ref 80.0–100.0)
Platelets: 134 10*3/uL — ABNORMAL LOW (ref 150–400)
RBC: 2.82 MIL/uL — ABNORMAL LOW (ref 3.87–5.11)
RDW: 19.9 % — ABNORMAL HIGH (ref 11.5–15.5)
WBC: 17.1 10*3/uL — ABNORMAL HIGH (ref 4.0–10.5)
nRBC: 4.2 % — ABNORMAL HIGH (ref 0.0–0.2)

## 2021-08-28 LAB — BASIC METABOLIC PANEL
Anion gap: 5 (ref 5–15)
BUN: 11 mg/dL (ref 8–23)
CO2: 25 mmol/L (ref 22–32)
Calcium: 8.3 mg/dL — ABNORMAL LOW (ref 8.9–10.3)
Chloride: 109 mmol/L (ref 98–111)
Creatinine, Ser: 0.8 mg/dL (ref 0.44–1.00)
GFR, Estimated: >60 mL/min (ref >60)
Glucose, Bld: 145 mg/dL — ABNORMAL HIGH (ref 70–99)
Potassium: 3.9 mmol/L (ref 3.5–5.1)
Sodium: 139 mmol/L (ref 135–145)

## 2021-08-28 LAB — BPAM RBC
Blood Product Expiration Date: 202210012359
Blood Product Expiration Date: 202210012359
Blood Product Expiration Date: 202210052359
Blood Product Expiration Date: 202210062359
ISSUE DATE / TIME: 202209021638
ISSUE DATE / TIME: 202209042037
ISSUE DATE / TIME: 202209051556
ISSUE DATE / TIME: 202209052223
Unit Type and Rh: 5100
Unit Type and Rh: 5100
Unit Type and Rh: 5100
Unit Type and Rh: 5100

## 2021-08-28 LAB — HEMOGLOBIN AND HEMATOCRIT, BLOOD
HCT: 25.8 % — ABNORMAL LOW (ref 36.0–46.0)
Hemoglobin: 8.5 g/dL — ABNORMAL LOW (ref 12.0–15.0)

## 2021-08-28 MED ORDER — IOHEXOL 350 MG/ML SOLN
100.0000 mL | Freq: Once | INTRAVENOUS | Status: AC | PRN
  Administered 2021-08-28: 25 mL via INTRA_ARTERIAL

## 2021-08-28 MED ORDER — IOHEXOL 350 MG/ML SOLN
100.0000 mL | Freq: Once | INTRAVENOUS | Status: AC | PRN
  Administered 2021-08-28: 50 mL via INTRA_ARTERIAL

## 2021-08-28 MED ORDER — MIDAZOLAM HCL 2 MG/2ML IJ SOLN
INTRAMUSCULAR | Status: AC
  Filled 2021-08-28: qty 2

## 2021-08-28 NOTE — Plan of Care (Signed)

## 2021-08-28 NOTE — Progress Notes (Signed)
[facility] Progress Note  Doris Boyd 75 y.o. 1946/01/05  CC: GI bleed   Subjective: Yesterday's events noted.  She was found to have active bleeding on CTA  and she underwent IR guided embolization of SMA branch supplying transverse colon.  Patient denies any symptoms this morning.  ROS : Afebrile.  Negative for chest pain   Objective: Vital signs in last 24 hours: Vitals:   08/28/21 0715 08/28/21 0842  BP: 135/70 118/62  Pulse: (!) 102 (!) 108  Resp: 18 18  Temp: 98.6 F (37 C) (!) 97.4 F (36.3 C)  SpO2: 100% 100%    Physical Exam:  General:  Alert, cooperative, no distress, appears stated age  Head:  Normocephalic, without obvious abnormality, atraumatic  Eyes:  , EOM's intact,   Lungs:   No visible respiratory distress  Heart:  Mild tachycardia noted  Abdomen:   Soft, non-tender, distended, bowel sounds present, no peritoneal signs  Psych Mood and affect normal       Lab Results: Recent Labs    08/25/21 1844 08/26/21 0647  NA  --  137  K  --  3.5  CL  --  108  CO2  --  23  GLUCOSE  --  114*  BUN 8 5*  CREATININE  --  0.65  CALCIUM  --  8.6*   Recent Labs    08/26/21 0647  AST 18  ALT 10  ALKPHOS 30*  BILITOT 0.1*  PROT 5.6*  ALBUMIN 2.8*   Recent Labs    08/26/21 0731 08/26/21 1311 08/27/21 2035 08/28/21 0549  WBC 8.1  --   --   --   HGB 7.2*   < > 7.7* 8.5*  HCT 22.4*   < > 23.6* 25.8*  MCV 82.7  --   --   --   PLT 207  --   --   --    < > = values in this interval not displayed.   No results for input(s): LABPROT, INR in the last 72 hours.    Assessment/Plan: -Lower GI bleeding. She was found to have active bleeding on CTA  and underwent IR guided embolization of SMA branch supplying transverse colon.  -Acute blood loss anemia   Recommendation -------------------------- -Start full liquid diet and slowly advance as tolerated -Monitor H&H.  Transfuse if hemoglobin less than 8 -No further inpatient GI work-up  planned.  GI will sign off. -  call us back if any evidence of further active bleeding   Kathi Der MD, FACP 08/28/2021, 9:28 AM  Contact #  [phone]

## 2021-08-28 NOTE — Procedures (Signed)
Interventional Radiology Procedure Note  Procedure: Ultrasound guided venous access, superior mesenteric arteriography, arteriography of transverse colonic arterial supply, embolization of SMA branch supplying transverse colon  Access: Right CFA  Complications: None  Estimated Blood Loss: 10 mL  Findings: Prior to arteriography, additional IV access needed and US guided access of right basilic vein above elbow performed with placement of 5 Fr micropuncture dilator for IV access.  SMA arteriography initially did not demonstrate active hemorrhage but later demonstrated intermittent hemorrhage from a distal SMA branch supplying the distal transverse colon in area of bleeding by CTA.  After advancement of a microcatheter into distal supply, could not catheterize a descending branch that was predominant supply to bleeding area so origin of vessel embolized with a single 1 mm x 2 cm coil with occlusion of the branch.  Will follow for any signs of further bleeding.  Jodi Marble. Fredia Sorrow, M.D Pager:  [phone]

## 2021-08-28 NOTE — Progress Notes (Signed)
PROGRESS NOTE  Doris Boyd  AOZ:[id number] DOB: [birth year] DOA: 08/24/2021 PCP: Marden Noble, MD   Brief Narrative: Doris Boyd is a 75 y.o. female with a history of pandiverticulosis who takes no medications and presented to the ED 9/2 with 3 days of painless red blood in stool turning more dark over time. In the ED she was orthostatic with hgb 8.1g/dl (from 96E/XB on 2/84), +FOBT, so GI was consulted, 1u PRBCs transfused, and patient admitted. Bleeding initially seemed to improve and CTA showed no source of bleeding. Unfortunately, she has again developed frequent stools characterized as dark and red bloody. PPI was started empirically and pt made NPO. An additional unit of blood was given 9/4. Over the course of 9/5, hematochezia returned. 2 more units of PRBCs transfused. CTA repeated and positive for bleeding in mid-transverse colon. IR subsequently performed embolization of branch vessel of SMA supplying distal transverse colon.   Assessment & Plan: Principal Problem:   GI bleed Active Problems:   Acute blood loss anemia  Acute blood loss anemia due transverse colon diverticular hemorrhage, pandiverticulosis: s/p coil embolization of supplying SMA branch on 9/6 by IR.  - Trend H/H. Has received 4u total of PRBCs this admission.  - Advance diet as tolerated.  - Check renal function this PM after contrast loads.  - Can DC empiric PPI.  Obesity: Estimated body mass index is 31.83 kg/m as calculated from the following:   Height as of 06/17/17: 5\' 2"  (1.575 m).   Weight as of 06/17/17: 78.9 kg.  DVT prophylaxis: SCDs Code Status: Full Family Communication: None at bedside. Husband by phone 9/4 and 9/6. Disposition Plan:  Status is: Inpatient  The patient will require care spanning > 2 midnights and should be moved to inpatient because:  Acute, transfusion-dependent blood loss anemia with need for continued monitoring  Dispo: The patient is from: Home              Anticipated  d/c is to: Home Needs to be without evidence of bleeding and stable hgb x24 hours at the least to be discharged.               Patient currently is not medically stable to d/c.  Consultants:  11/6 GI Interventional Radiology  Procedures:  1u PRBC 9/2 1u PRBC 9/4 2u PRBC 9/5 08/28/2021 Dr. 10/28/2021: Ultrasound guided venous access, superior mesenteric arteriography, arteriography of transverse colonic arterial supply, embolization of SMA branch supplying transverse colon  Antimicrobials: None   Subjective: Feeling tired this morning, but no BM since yesterday evening. No abd pain. No nausea, in fact she's hungry.   Objective: BP 118/62 (BP Location: Left Arm)   Pulse (!) 108   Temp (!) 97.4 F (36.3 C) (Oral)   Resp 18   SpO2 100%   Gen: 74 y.o. female in no distress Pulm: Nonlabored breathing room air. Clear. CV: Regular rate and rhythm. No murmur, rub, or gallop. No JVD, no dependent edema. GI: Abdomen soft, non-tender, non-distended, with normoactive bowel sounds.  Ext: Warm, no deformities Skin: No rashes, lesions or ulcers on visualized skin. Neuro: Alert and oriented. No focal neurological deficits. Psych: Judgement and insight appear fair. Mood euthymic & affect congruent. Behavior is appropriate.    Data Reviewed: I have personally reviewed following labs and imaging studies  CBC: Recent Labs  Lab 08/24/21 1450 08/24/21 2151 08/25/21 0228 08/25/21 1125 08/26/21 0731 08/26/21 1311 08/26/21 2038 08/27/21 0253 08/27/21 1330 08/27/21 2035 08/28/21 0549  WBC 9.1  --  9.9  --  8.1  --   --   --   --   --   --   NEUTROABS 5.0  --   --   --   --   --   --   --   --   --   --   HGB 8.1*   < > 8.4*   < > 7.2*   < > 7.3* 7.9* 6.6* 7.7* 8.5*  HCT 25.8*   < > 26.1*   < > 22.4*   < > 22.6* 24.0* 20.6* 23.6* 25.8*  MCV 83.2  --  81.8  --  82.7  --   --   --   --   --   --   PLT 208  --  211  --  207  --   --   --   --   --   --    < > = values in this interval not  displayed.   Basic Metabolic Panel: Recent Labs  Lab 08/24/21 1450 08/25/21 0228 08/25/21 1844 08/26/21 0647  NA 139 139  --  137  K 4.3 4.2  --  3.5  CL 109 107  --  108  CO2 24 24  --  23  GLUCOSE 101* 101*  --  114*  BUN 14 12 8  5*  CREATININE 0.62 0.53  --  0.65  CALCIUM 9.2 9.1  --  8.6*   GFR: CrCl cannot be calculated (Unknown ideal weight.). Liver Function Tests: Recent Labs  Lab 08/24/21 1450 08/26/21 0647  AST 17 18  ALT 10 10  ALKPHOS 33* 30*  BILITOT 0.6 0.1*  PROT 6.2* 5.6*  ALBUMIN 3.2* 2.8*   No results for input(s): LIPASE, AMYLASE in the last 168 hours. No results for input(s): AMMONIA in the last 168 hours. Coagulation Profile: Recent Labs  Lab 08/24/21 1450  INR 1.1   Cardiac Enzymes: No results for input(s): CKTOTAL, CKMB, CKMBINDEX, TROPONINI in the last 168 hours. BNP (last 3 results) No results for input(s): PROBNP in the last 8760 hours. HbA1C: No results for input(s): HGBA1C in the last 72 hours. CBG: Recent Labs  Lab 08/26/21 1808  GLUCAP 138*   Lipid Profile: No results for input(s): CHOL, HDL, LDLCALC, TRIG, CHOLHDL, LDLDIRECT in the last 72 hours. Thyroid Function Tests: No results for input(s): TSH, T4TOTAL, FREET4, T3FREE, THYROIDAB in the last 72 hours. Anemia Panel: No results for input(s): VITAMINB12, FOLATE, FERRITIN, TIBC, IRON, RETICCTPCT in the last 72 hours. Urine analysis:    Component Value Date/Time   COLORURINE YELLOW 08/24/2021 1932   APPEARANCEUR CLEAR 08/24/2021 1932   LABSPEC 1.020 08/24/2021 1932   PHURINE 5.0 08/24/2021 1932   GLUCOSEU NEGATIVE 08/24/2021 1932   HGBUR MODERATE (A) 08/24/2021 1932   BILIRUBINUR NEGATIVE 08/24/2021 1932   KETONESUR 5 (A) 08/24/2021 1932   PROTEINUR NEGATIVE 08/24/2021 1932   NITRITE NEGATIVE 08/24/2021 1932   LEUKOCYTESUR NEGATIVE 08/24/2021 1932   Recent Results (from the past 240 hour(s))  Resp Panel by RT-PCR (Flu A&B, Covid) Nasopharyngeal Swab     Status:  None   Collection Time: 08/24/21  1:10 PM   Specimen: Nasopharyngeal Swab; Nasopharyngeal(NP) swabs in vial transport medium  Result Value Ref Range Status   SARS Coronavirus 2 by RT PCR NEGATIVE NEGATIVE Final    Comment: (NOTE) SARS-CoV-2 target nucleic acids are NOT DETECTED.  The SARS-CoV-2 RNA is generally detectable in upper respiratory specimens during the acute phase of infection.  The lowest concentration of SARS-CoV-2 viral copies this assay can detect is 138 copies/mL. A negative result does not preclude SARS-Cov-2 infection and should not be used as the sole basis for treatment or other patient management decisions. A negative result may occur with  improper specimen collection/handling, submission of specimen other than nasopharyngeal swab, presence of viral mutation(s) within the areas targeted by this assay, and inadequate number of viral copies(<138 copies/mL). A negative result must be combined with clinical observations, patient history, and epidemiological information. The expected result is Negative.  Fact Sheet for Patients:  BloggerCourse.com  Fact Sheet for Healthcare Providers:  SeriousBroker.it  This test is no t yet approved or cleared by the [place] FDA and  has been authorized for detection and/or diagnosis of SARS-CoV-2 by FDA under an Emergency Use Authorization (EUA). This EUA will remain  in effect (meaning this test can be used) for the duration of the COVID-19 declaration under Section 564(b)(1) of the Act, 21 U.S.C.section 360bbb-3(b)(1), unless the authorization is terminated  or revoked sooner.       Influenza A by PCR NEGATIVE NEGATIVE Final   Influenza B by PCR NEGATIVE NEGATIVE Final    Comment: (NOTE) The Xpert Xpress SARS-CoV-2/FLU/RSV plus assay is intended as an aid in the diagnosis of influenza from Nasopharyngeal swab specimens and should not be used as a sole basis for  treatment. Nasal washings and aspirates are unacceptable for Xpert Xpress SARS-CoV-2/FLU/RSV testing.  Fact Sheet for Patients: BloggerCourse.com  Fact Sheet for Healthcare Providers: SeriousBroker.it  This test is not yet approved or cleared by the [place] FDA and has been authorized for detection and/or diagnosis of SARS-CoV-2 by FDA under an Emergency Use Authorization (EUA). This EUA will remain in effect (meaning this test can be used) for the duration of the COVID-19 declaration under Section 564(b)(1) of the Act, 21 U.S.C. section 360bbb-3(b)(1), unless the authorization is terminated or revoked.  Performed at [facility], [address], [place], [place] [address]       Radiology Studies: CT ANGIO GI BLEED  Result Date: 08/27/2021 CLINICAL DATA:  GI bleed. EXAM: CTA ABDOMEN AND PELVIS WITHOUT AND WITH CONTRAST TECHNIQUE: Multidetector CT imaging of the abdomen and pelvis was performed using the standard protocol during bolus administration of intravenous contrast. Multiplanar reconstructed images and MIPs were obtained and reviewed to evaluate the vascular anatomy. CONTRAST:  OMNIPAQUE IOHEXOL 350 MG/ML SOLN COMPARISON:  Abdominal CTA 2 days ago 08/25/2021 FINDINGS: VASCULAR Aorta: Mild atherosclerosis. No aneurysm, dissection, or significant stenosis. No periaortic stranding or inflammation. Celiac: The right hepatic artery has a separate origin arising from the abdominal aorta. Celiac artery is patent. Distal branch vessels are patent. SMA: Patent without evidence of aneurysm, dissection, vasculitis or significant stenosis. Renals: Mild calcified plaque in the origin of the renal arteries without significant stenosis. No acute findings or change from prior. Single bilateral renal arteries. IMA: Patent. Inflow: Patent without evidence of aneurysm, dissection, vasculitis or significant stenosis. Iliac tortuosity  again seen. Proximal Outflow: Bilateral common femoral and visualized portions of the superficial and profunda femoral arteries are patent without evidence of aneurysm, dissection, vasculitis or significant stenosis. Veins: No obvious venous abnormality within the limitations of this arterial phase study. Review of the MIP images confirms the above findings. NON-VASCULAR Lower chest: Lung bases are clear. No acute airspace disease or pleural effusion. Hepatobiliary: No focal hepatic abnormality. There is vicarious excretion of IV contrast in the gallbladder accounting for hyperdense appearance on  the current exam. No biliary dilatation or pericholecystic inflammation. Pancreas: No ductal dilatation or inflammation. Spleen: Normal in size without focal abnormality. Adrenals/Urinary Tract: No adrenal nodule. Homogeneous renal enhancement without hydronephrosis. Again seen right renal cysts. No suspicious renal lesion. Unremarkable urinary bladder. Stomach/Bowel: Examination is positive for active extravasation of contrast into the GI tract. There is extravasation of contrast into the mid transverse colon, series 10, image 94, that increases on venous phase. No additional sites of IV contrast extravasation. Diffuse colonic diverticulosis without diverticulitis. Normal appendix. Lymphatic: No abdominopelvic adenopathy. Reproductive: Partial hysterectomy with prominence of the vaginal cuff unchanged. No adnexal mass. Other: No ascites or free air. Small fat containing umbilical hernia. Small bilateral fat containing inguinal hernias. Musculoskeletal: There are no acute or suspicious osseous abnormalities. Transitional lumbosacral anatomy with enlarged transverse processes and pseudoarticulation of the sacrum with L5. Mild bilateral hip osteoarthritis. IMPRESSION: VASCULAR 1. Examination is positive for active GI bleed in the mid distal transverse colon. 2. Mild aortic atherosclerosis. NON-VASCULAR 1. Colonic  diverticulosis without focal diverticulitis. 2. Small fat containing bilateral inguinal and umbilical hernias. Electronically Signed   By: Narda RutherfordMelanie  Sanford M.D.   On: 08/27/2021 21:31    Scheduled Meds:  sodium chloride   Intravenous Once   lidocaine       pantoprazole  40 mg Intravenous Daily   sodium chloride flush  3 mL Intravenous Q12H   Continuous Infusions:  lactated ringers 75 mL/hr at 08/28/21 0924     LOS: 3 days   Time spent: 35 minutes.  Tyrone Nineyan B Ailey Wessling, MD [facility] [email or web address] 08/28/2021, 11:05 AM

## 2021-08-28 NOTE — Care Management Important Message (Signed)
Important Message  Patient Details  Name: Doris Boyd MRN: [id number] Date of Birth: January 21, 1946   Medicare Important Message Given:  Yes     [name] Stefan Church 08/28/2021, 1:57 PM

## 2021-08-29 DIAGNOSIS — K922 Gastrointestinal hemorrhage, unspecified: Secondary | ICD-10-CM

## 2021-08-29 LAB — BASIC METABOLIC PANEL
Anion gap: 8 (ref 5–15)
BUN: 13 mg/dL (ref 8–23)
CO2: 24 mmol/L (ref 22–32)
Calcium: 8.4 mg/dL — ABNORMAL LOW (ref 8.9–10.3)
Chloride: 106 mmol/L (ref 98–111)
Creatinine, Ser: 0.73 mg/dL (ref 0.44–1.00)
GFR, Estimated: >60 mL/min (ref >60)
Glucose, Bld: 125 mg/dL — ABNORMAL HIGH (ref 70–99)
Potassium: 3.6 mmol/L (ref 3.5–5.1)
Sodium: 138 mmol/L (ref 135–145)

## 2021-08-29 LAB — CBC
HCT: 19.3 % — ABNORMAL LOW (ref 36.0–46.0)
Hemoglobin: 6.3 g/dL — CL (ref 12.0–15.0)
MCH: 27 pg (ref 26.0–34.0)
MCHC: 32.6 g/dL (ref 30.0–36.0)
MCV: 82.8 fL (ref 80.0–100.0)
Platelets: 135 10*3/uL — ABNORMAL LOW (ref 150–400)
RBC: 2.33 MIL/uL — ABNORMAL LOW (ref 3.87–5.11)
RDW: 20.2 % — ABNORMAL HIGH (ref 11.5–15.5)
WBC: 14.3 10*3/uL — ABNORMAL HIGH (ref 4.0–10.5)
nRBC: 5 % — ABNORMAL HIGH (ref 0.0–0.2)

## 2021-08-29 LAB — HEMOGLOBIN AND HEMATOCRIT, BLOOD
HCT: 27.8 % — ABNORMAL LOW (ref 36.0–46.0)
Hemoglobin: 9.2 g/dL — ABNORMAL LOW (ref 12.0–15.0)

## 2021-08-29 LAB — PREPARE RBC (CROSSMATCH)

## 2021-08-29 MED ORDER — ACETAMINOPHEN 325 MG PO TABS
650.0000 mg | ORAL_TABLET | Freq: Once | ORAL | Status: AC
  Administered 2021-08-29: 650 mg via ORAL
  Filled 2021-08-29: qty 2

## 2021-08-29 MED ORDER — FUROSEMIDE 10 MG/ML IJ SOLN
20.0000 mg | Freq: Once | INTRAMUSCULAR | Status: AC
  Administered 2021-08-29: 20 mg via INTRAVENOUS
  Filled 2021-08-29: qty 2

## 2021-08-29 MED ORDER — DIPHENHYDRAMINE HCL 25 MG PO CAPS
25.0000 mg | ORAL_CAPSULE | Freq: Once | ORAL | Status: AC
  Administered 2021-08-29: 25 mg via ORAL
  Filled 2021-08-29: qty 1

## 2021-08-29 MED ORDER — SODIUM CHLORIDE 0.9% IV SOLUTION: Freq: Once | INTRAVENOUS | Status: AC

## 2021-08-29 NOTE — Progress Notes (Signed)
[facility] paged informed of 6.3 HGB. Awaiting new orders

## 2021-08-29 NOTE — Progress Notes (Signed)
PROGRESS NOTE  ARISSA FAGIN FGH:[id number] DOB: [birth year] DOA: 08/24/2021 PCP: Marden Noble, MD  HPI/Recap of past 24 hours: RAYNEE MCCASLAND is a 75 y.o. female with a history of pandiverticulosis who takes no medications and presented to the ED 9/2 with 3 days of painless red blood in stool turning more dark over time. In the ED she was orthostatic with hgb 8.1g/dl (from 67E/LF on 8/10), +FOBT, so GI was consulted, 1u PRBCs transfused, and patient admitted. Bleeding initially seemed to improve and CTA showed no source of bleeding. Unfortunately, she has again developed frequent stools characterized as dark and red bloody. PPI was started empirically and pt made NPO. An additional unit of blood was given 9/4. Over the course of 9/5, hematochezia returned. 2 more units of PRBCs transfused. CTA repeated and positive for bleeding in mid-transverse colon. IR subsequently performed embolization of branch vessel of SMA supplying distal transverse colon.   08/29/2021: Seen and examined at her bedside.  Her husband was present in the room.  She reported generalized weakness and fatigue.  Blood being transfused in the room.  Hemoglobin 6.3 this morning, 2 unit PRBCs ordered to be transfused.  She denies any bowel movements since waking up this morning, at the time of this visit.  Assessment/Plan: Principal Problem:   GI bleed Active Problems:   Acute blood loss anemia  Acute blood loss anemia due transverse colon diverticular hemorrhage, pandiverticulosis:  s/p coil embolization of supplying SMA branch on 9/6 by IR.  - Trend H/H. Has received 4u total of PRBCs this admission.  Drop in hemoglobin this morning 6.3, additional 2 unit PRBC ordered to be transfused. Diet advanced to regular this morning. Repeat CBC in the morning.  Leukocytosis, likely reactive in the setting of severe anemia WBC downtrending 14.3 from 17.1. No evidence of active infective process Continue to closely monitor  Mild  thrombocytopenia Platelet count slightly uptrending 135 from 134. Monitor for now   Obesity: Estimated body mass index is 31.83 kg/m as calculated from the following:   Height as of 06/17/17: 5\' 2"  (1.575 m).   Weight as of 06/17/17: 78.9 kg.   DVT prophylaxis: SCDs Code Status: Full Family Communication: None at bedside. Husband by phone 9/4 and 9/6. Disposition Plan:  Status is: Inpatient   The patient will require care spanning > 2 midnights and should be moved to inpatient because:  Acute, transfusion-dependent blood loss anemia with need for continued monitoring   Dispo: The patient is from: Home              Anticipated d/c is to: Home Needs to be without evidence of bleeding and stable hgb x24 hours at the least to be discharged.               Patient currently is not medically stable to d/c.   Consultants:  [facility]   Procedures:  1u PRBC 9/2 1u PRBC 9/4 2u PRBC 9/5 08/28/2021 Dr. 10/28/2021: Ultrasound guided venous access, superior mesenteric arteriography, arteriography of transverse colonic arterial supply, embolization of SMA branch supplying transverse colon   Antimicrobials: None        Objective: Vitals:   08/29/21 0900 08/29/21 1136 08/29/21 1137 08/29/21 1203  BP: 126/61 115/60 115/60 117/61  Pulse:  95 95 96  Resp:   18 16  Temp: 97.6 F (36.4 C) 97.6 F (36.4 C) 97.6 F (36.4 C) 98.9 F (37.2 C)  TempSrc: Oral Oral Oral Oral  SpO2:  100%    Intake/Output Summary (Last 24 hours) at 08/29/2021 1347 Last data filed at 08/28/2021 1802 Gross per 24 hour  Intake 480 ml  Output --  Net 480 ml   There were no vitals filed for this visit.  Exam:  General: 75 y.o. year-old female well developed well nourished in no acute distress.  Alert and oriented x3.  Weak appearing. Cardiovascular: Regular rate and rhythm with no rubs or gallops.  No thyromegaly or JVD noted.   Respiratory: Clear to auscultation with no wheezes or  rales. Good inspiratory effort. Abdomen: Soft nontender nondistended with normal bowel sounds x4 quadrants. Musculoskeletal: Trace lower extremity edema. 2/4 pulses in all 4 extremities. Skin: No ulcerative lesions noted or rashes, Psychiatry: Mood is appropriate for condition and setting   Data Reviewed: CBC: Recent Labs  Lab 08/24/21 1450 08/24/21 2151 08/25/21 0228 08/25/21 1125 08/26/21 0731 08/26/21 1311 08/27/21 1330 08/27/21 2035 08/28/21 0549 08/28/21 1450 08/29/21 0328  WBC 9.1  --  9.9  --  8.1  --   --   --   --  17.1* 14.3*  NEUTROABS 5.0  --   --   --   --   --   --   --   --   --   --   HGB 8.1*   < > 8.4*   < > 7.2*   < > 6.6* 7.7* 8.5* 7.7* 6.3*  HCT 25.8*   < > 26.1*   < > 22.4*   < > 20.6* 23.6* 25.8* 23.3* 19.3*  MCV 83.2  --  81.8  --  82.7  --   --   --   --  82.6 82.8  PLT 208  --  211  --  207  --   --   --   --  134* 135*   < > = values in this interval not displayed.   Basic Metabolic Panel: Recent Labs  Lab 08/24/21 1450 08/25/21 0228 08/25/21 1844 08/26/21 0647 08/28/21 1450 08/29/21 0328  NA 139 139  --  137 139 138  K 4.3 4.2  --  3.5 3.9 3.6  CL 109 107  --  108 109 106  CO2 24 24  --  23 25 24   GLUCOSE 101* 101*  --  114* 145* 125*  BUN 14 12 8  5* 11 13  CREATININE 0.62 0.53  --  0.65 0.80 0.73  CALCIUM 9.2 9.1  --  8.6* 8.3* 8.4*   GFR: CrCl cannot be calculated (Unknown ideal weight.). Liver Function Tests: Recent Labs  Lab 08/24/21 1450 08/26/21 0647  AST 17 18  ALT 10 10  ALKPHOS 33* 30*  BILITOT 0.6 0.1*  PROT 6.2* 5.6*  ALBUMIN 3.2* 2.8*   No results for input(s): LIPASE, AMYLASE in the last 168 hours. No results for input(s): AMMONIA in the last 168 hours. Coagulation Profile: Recent Labs  Lab 08/24/21 1450  INR 1.1   Cardiac Enzymes: No results for input(s): CKTOTAL, CKMB, CKMBINDEX, TROPONINI in the last 168 hours. BNP (last 3 results) No results for input(s): PROBNP in the last 8760 hours. HbA1C: No  results for input(s): HGBA1C in the last 72 hours. CBG: Recent Labs  Lab 08/26/21 1808  GLUCAP 138*   Lipid Profile: No results for input(s): CHOL, HDL, LDLCALC, TRIG, CHOLHDL, LDLDIRECT in the last 72 hours. Thyroid Function Tests: No results for input(s): TSH, T4TOTAL, FREET4, T3FREE, THYROIDAB in the last 72 hours. Anemia Panel: No results for  input(s): VITAMINB12, FOLATE, FERRITIN, TIBC, IRON, RETICCTPCT in the last 72 hours. Urine analysis:    Component Value Date/Time   COLORURINE YELLOW 08/24/2021 1932   APPEARANCEUR CLEAR 08/24/2021 1932   LABSPEC 1.020 08/24/2021 1932   PHURINE 5.0 08/24/2021 1932   GLUCOSEU NEGATIVE 08/24/2021 1932   HGBUR MODERATE (A) 08/24/2021 1932   BILIRUBINUR NEGATIVE 08/24/2021 1932   KETONESUR 5 (A) 08/24/2021 1932   PROTEINUR NEGATIVE 08/24/2021 1932   NITRITE NEGATIVE 08/24/2021 1932   LEUKOCYTESUR NEGATIVE 08/24/2021 1932   Sepsis Labs: @LABRCNTIP (procalcitonin:4,lacticidven:4)  ) Recent Results (from the past 240 hour(s))  Resp Panel by RT-PCR (Flu A&B, Covid) Nasopharyngeal Swab     Status: None   Collection Time: 08/24/21  1:10 PM   Specimen: Nasopharyngeal Swab; Nasopharyngeal(NP) swabs in vial transport medium  Result Value Ref Range Status   SARS Coronavirus 2 by RT PCR NEGATIVE NEGATIVE Final    Comment: (NOTE) SARS-CoV-2 target nucleic acids are NOT DETECTED.  The SARS-CoV-2 RNA is generally detectable in upper respiratory specimens during the acute phase of infection. The lowest concentration of SARS-CoV-2 viral copies this assay can detect is 138 copies/mL. A negative result does not preclude SARS-Cov-2 infection and should not be used as the sole basis for treatment or other patient management decisions. A negative result may occur with  improper specimen collection/handling, submission of specimen other than nasopharyngeal swab, presence of viral mutation(s) within the areas targeted by this assay, and inadequate  number of viral copies(<138 copies/mL). A negative result must be combined with clinical observations, patient history, and epidemiological information. The expected result is Negative.  Fact Sheet for Patients:  10/24/21  Fact Sheet for Healthcare Providers:  BloggerCourse.com  This test is no t yet approved or cleared by the [employer] FDA and  has been authorized for detection and/or diagnosis of SARS-CoV-2 by FDA under an Emergency Use Authorization (EUA). This EUA will remain  in effect (meaning this test can be used) for the duration of the COVID-19 declaration under Section 564(b)(1) of the Act, 21 U.S.C.section 360bbb-3(b)(1), unless the authorization is terminated  or revoked sooner.       Influenza A by PCR NEGATIVE NEGATIVE Final   Influenza B by PCR NEGATIVE NEGATIVE Final    Comment: (NOTE) The Xpert Xpress SARS-CoV-2/FLU/RSV plus assay is intended as an aid in the diagnosis of influenza from Nasopharyngeal swab specimens and should not be used as a sole basis for treatment. Nasal washings and aspirates are unacceptable for Xpert Xpress SARS-CoV-2/FLU/RSV testing.  Fact Sheet for Patients: [place]  Fact Sheet for Healthcare Providers: BloggerCourse.com  This test is not yet approved or cleared by the [employer] FDA and has been authorized for detection and/or diagnosis of SARS-CoV-2 by FDA under an Emergency Use Authorization (EUA). This EUA will remain in effect (meaning this test can be used) for the duration of the COVID-19 declaration under Section 564(b)(1) of the Act, 21 U.S.C. section 360bbb-3(b)(1), unless the authorization is terminated or revoked.  Performed at [facility], [address] [place], [place]       Studies: No results found.  Scheduled Meds:  sodium chloride   Intravenous Once   sodium  chloride flush  3 mL Intravenous Q12H    Continuous Infusions:   LOS: 4 days     [id number], MD [facility] Pager [phone]  If 7PM-7AM, please contact night-coverage [email or web address] Password [email or web address] 08/29/2021, 1:47 PM

## 2021-08-29 NOTE — Progress Notes (Signed)
Referring Physician(s): Dr. Aubery Lapping  Supervising Physician: Simonne Come  Patient Status:  [facility] - In-pt  Chief Complaint:  Arteriogram  with catheterization and single coil embolization of the fourth order descending branch supplying the transverse colon  by Dr. Fredia Sorrow on 9.6.22  Subjective:  Patient seen at bedside with husband present. Currently without any significant complaints. Patient alert and laying in bed, calm and comfortable. Denies any fevers, headache, chest pain, SOB, cough, abdominal pain, nausea, vomiting or overt bleeding. Denies any bowel movements today.    Allergies: Ciprofloxacin  Medications: Prior to Admission medications   Medication Sig Start Date End Date Taking? Authorizing Provider  acetaminophen (TYLENOL) 500 MG tablet Take 500-1,000 mg by mouth every 8 (eight) hours as needed for moderate pain, headache or mild pain.   Yes [provider]  benzonatate (TESSALON) 100 MG capsule Use 1 every 4 hour as needed for cough Patient not taking: Reported on 08/24/2021 11/25/12   Storm Frisk, MD  NONFORMULARY OR COMPOUNDED ITEM Antiinflammatory Cream: Diclofenac 3%, Baclofen 2%, Cyclobenzaprine 2%, Lidocaine 2% dispense 120gram, apply 1-2 grams to affected area 3-4 times daily Patient not taking: Reported on 08/24/2021 09/29/15   Lenn Sink, DPM     Vital Signs: BP 117/61   Pulse 96   Temp 98.9 F (37.2 C) (Oral)   Resp 16   SpO2 100%   Physical Exam Vitals and nursing note reviewed.  Constitutional:      Appearance: She is well-developed.  HENT:     Head: Normocephalic and atraumatic.  Eyes:     Conjunctiva/sclera: Conjunctivae normal.  Cardiovascular:     Comments: Right femoral site is soft with no active bleeding and no appreciable pseudoaneurysm. Dressing is C/D/I   Pulmonary:     Effort: Pulmonary effort is normal.  Musculoskeletal:        General: Normal range of motion.     Cervical back: Normal range of motion.   Skin:    General: Skin is warm.  Neurological:     Mental Status: She is alert and oriented to person, place, and time.    Imaging: IR Angiogram Visceral Selective  Result Date: 08/29/2021 INDICATION: Lower GI bleed and hypotension with positive CT angiography demonstrating active extravasation of contrast from a branch supplying the distal transverse colon. The patient has a single current functioning IV through which she is receiving blood. There has been inability to establish a second peripheral IV. EXAM: 1. ULTRASOUND-GUIDED PERIPHERAL INTRAVENOUS ACCESS 2. ULTRASOUND GUIDANCE FOR VASCULAR ACCESS OF THE RIGHT COMMON FEMORAL ARTERY 3. SELECTIVE ARTERIOGRAPHY OF THE SUPERIOR MESENTERIC ARTERY 4. ADDITIONAL SELECTIVE ARTERIOGRAPHY OF A THIRD ORDER SMA BRANCH 5. ADDITIONAL SELECTIVE ARTERIOGRAPHY A THIRD ORDER SMA BRANCH 6. ADDITIONAL SELECTIVE ARTERIOGRAPHY A FOURTH ORDER SMA BRANCH 7. TRANSCATHETER EMBOLIZATION OF SMA BRANCH 8. FOLLOW-UP ARTERIOGRAPHY AFTER EMBOLIZATION MEDICATIONS: None ANESTHESIA/SEDATION: Moderate (conscious) sedation was employed during this procedure. A total of Versed 3.0 mg and Fentanyl 100 mcg was administered intravenously. Moderate Sedation Time: 99 minutes. The patient's level of consciousness and vital signs were monitored continuously by radiology nursing throughout the procedure under my direct supervision. CONTRAST:  75 mL Omnipaque 350 FLUOROSCOPY TIME:  Fluoroscopy Time: 33 minutes.  716 mGy COMPLICATIONS: None immediate. PROCEDURE: Informed consent was obtained from the patient's husband following explanation of the procedure, risks, benefits and alternatives. The patient and her husband understand, agree and consent for the procedure. All questions were addressed. The husband signed consent due to patient history of  history of mild dementia. A time out was performed prior to the initiation of the procedure. Maximal barrier sterile technique utilized including caps,  mask, sterile gowns, sterile gloves, large sterile drape, hand hygiene, and chlorhexidine prep. Ultrasound was used to confirm patency of the right basilic vein in the upper arm. Local anesthesia was provided with 1% lidocaine. Under ultrasound guidance, a 21 gauge needle was utilized to access the vein. Ultrasound image documentation was performed. A guidewire was advanced. A 4 French micropuncture dilator was advanced into the vein and secured for use as intravenous access. Ultrasound was used to confirm patency of the right common femoral artery. Local anesthesia was provided with 1% lidocaine. Under ultrasound guidance, a 21 gauge needle was advanced into the right common femoral artery. Ultrasound image documentation was performed. After placement of a micropuncture dilator, a 5 French sheath was then placed over a guidewire. A 5 French Cobra catheter was advanced into the abdominal aorta and used to selectively catheterize the superior mesenteric artery. Selective arteriography of the superior mesenteric artery was performed in different projections. A microcatheter was then advanced through the 5 French catheter and into the trunk of the superior mesenteric artery. A third order branch vessel was catheterized and selective arteriography performed. A separate second order SMA branch was then catheterized with a microcatheter followed by further catheterization of a third order branch. Selective arteriography was performed. The microcatheter was then used to further catheterize a fourth order branch. Selective arteriography was performed. Transcatheter embolization was performed with deployment of a single 1 mm x 2 cm [place] LP coil. Additional follow-up arteriography was performed. After catheter removal and sheath removal, hemostasis was obtained with use of the Cordis ExoSeal device. FINDINGS: Intravenous access was successfully stab list at the level of the right basilic vein in the upper arm. This access was  utilized for conscious sedation during the procedure and was also kept in place for use during hospital admission. Initial selective arteriography of the superior mesenteric artery demonstrated normal patency of branch vessels without evidence of active bleeding, pseudoaneurysm, vascular malformation or other vascular abnormality. On the third selective SMA arteriogram, there was evidence active contrast extravasation at the level of the mid to distal transverse colon in the region of bleeding identified by CT angiography. 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 After mapping out the probable route of supply to the transverse colon, a tortuous proximal branch was eventually able to be catheterized that supplies the transverse colon. Initial selective arteriography at the level of this branch did not re-demonstrate active bleeding. However, with further advancement of the catheter into the origin of a fourth order descending transverse colonic branch, active extravasation of contrast material was again demonstrated. The microcatheter could not be advanced beyond the origin of this branch vessel due to tortuosity and small vessel caliber as well as probable component of spasm. Therefore, coil embolization was performed at the base of this vessel resulting in occlusion as well as some additional branch vessels distal to the coil. Follow-up arteriography did not demonstrate active extravasation at the level of previously demonstrated bleeding. IMPRESSION: 1. Additional peripheral intravenous access established under ultrasound guidance for use for IV conscious sedation during the procedure. 2. Arteriography of the superior mesenteric artery as well as multiple distal branch vessel supply demonstrates active contrast extravasation from a fourth order descending branch  supplying  the transverse colon at the level of active bleeding identified by CT angiography. The origin of this branch vessel was able to be successfully catheterized with deployment of a single embolization coil resulting in branch vessel occlusion and lack of contrast extravasation following embolization. Electronically Signed   By: Irish Lack M.D.   On: 08/29/2021 08:36   IR Angiogram Selective Each Additional Vessel  Result Date: 08/29/2021 INDICATION: Lower GI bleed and hypotension with positive CT angiography demonstrating active extravasation of contrast from a branch supplying the distal transverse colon. The patient has a single current functioning IV through which she is receiving blood. There has been inability to establish a second peripheral IV. EXAM: 1. ULTRASOUND-GUIDED PERIPHERAL INTRAVENOUS ACCESS 2. ULTRASOUND GUIDANCE FOR VASCULAR ACCESS OF THE RIGHT COMMON FEMORAL ARTERY 3. SELECTIVE ARTERIOGRAPHY OF THE SUPERIOR MESENTERIC ARTERY 4. ADDITIONAL SELECTIVE ARTERIOGRAPHY OF A THIRD ORDER SMA BRANCH 5. ADDITIONAL SELECTIVE ARTERIOGRAPHY A THIRD ORDER SMA BRANCH 6. ADDITIONAL SELECTIVE ARTERIOGRAPHY A FOURTH ORDER SMA BRANCH 7. TRANSCATHETER EMBOLIZATION OF SMA BRANCH 8. FOLLOW-UP ARTERIOGRAPHY AFTER EMBOLIZATION MEDICATIONS: None ANESTHESIA/SEDATION: Moderate (conscious) sedation was employed during this procedure. A total of Versed 3.0 mg and Fentanyl 100 mcg was administered intravenously. Moderate Sedation Time: 99 minutes. The patient's level of consciousness and vital signs were monitored continuously by radiology nursing throughout the procedure under my direct supervision. CONTRAST:  75 mL Omnipaque 350 FLUOROSCOPY TIME:  Fluoroscopy Time: 33 minutes.  716 mGy COMPLICATIONS: None immediate. PROCEDURE: Informed consent was obtained from the patient's husband following explanation of the procedure, risks, benefits and alternatives. The patient and her husband understand, agree and  consent for the procedure. All questions were addressed. The husband signed consent due to patient history of history of mild dementia. A time out was performed prior to the initiation of the procedure. Maximal barrier sterile technique utilized including caps, mask, sterile gowns, sterile gloves, large sterile drape, hand hygiene, and chlorhexidine prep. Ultrasound was used to confirm patency of the right basilic vein in the upper arm. Local anesthesia was provided with 1% lidocaine. Under ultrasound guidance, a 21 gauge needle was utilized to access the vein. Ultrasound image documentation was performed. A guidewire was advanced. A 4 French micropuncture dilator was advanced into the vein and secured for use as intravenous access. Ultrasound was used to confirm patency of the right common femoral artery. Local anesthesia was provided with 1% lidocaine. Under ultrasound guidance, a 21 gauge needle was advanced into the right common femoral artery. Ultrasound image documentation was performed. After placement of a micropuncture dilator, a 5 French sheath was then placed over a guidewire. A 5 French Cobra catheter was advanced into the abdominal aorta and used to selectively catheterize the superior mesenteric artery. Selective arteriography of the superior mesenteric artery was performed in different projections. A microcatheter was then advanced through the 5 French catheter and into the trunk of the superior mesenteric artery. A third order branch vessel was catheterized and selective arteriography performed. A separate second order SMA branch was then catheterized with a microcatheter followed by further catheterization of a third order branch. Selective arteriography was performed. The microcatheter was then used to further catheterize a fourth order branch. Selective arteriography was performed. Transcatheter embolization was performed with deployment of a single 1 mm x 2 cm [place] LP coil. Additional follow-up  arteriography was performed. After catheter removal and sheath removal, hemostasis was obtained with use of the Cordis ExoSeal device. FINDINGS: Intravenous access was successfully stab list at the level of  the right basilic vein in the upper arm. This access was utilized for conscious sedation during the procedure and was also kept in place for use during hospital admission. Initial selective arteriography of the superior mesenteric artery demonstrated normal patency of branch vessels without evidence of active bleeding, pseudoaneurysm, vascular malformation or other vascular abnormality. On the third selective SMA arteriogram, there was evidence active contrast extravasation at the level of the mid to distal transverse colon in the region of bleeding identified by CT angiography. 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 After mapping out the probable route of supply to the transverse colon, a tortuous proximal branch was eventually able to be catheterized that supplies the transverse colon. Initial selective arteriography at the level of this branch did not re-demonstrate active bleeding. However, with further advancement of the catheter into the origin of a fourth order descending transverse colonic branch, active extravasation of contrast material was again demonstrated. The microcatheter could not be advanced beyond the origin of this branch vessel due to tortuosity and small vessel caliber as well as probable component of spasm. Therefore, coil embolization was performed at the base of this vessel resulting in occlusion as well as some additional branch vessels distal to the coil. Follow-up arteriography did not demonstrate active extravasation at the level of previously demonstrated bleeding. IMPRESSION: 1. Additional peripheral intravenous access established  under ultrasound guidance for use for IV conscious sedation during the procedure. 2. Arteriography of the superior mesenteric artery as well as multiple distal branch vessel supply demonstrates active contrast extravasation from a fourth order descending branch supplying the transverse colon at the level of active bleeding identified by CT angiography. The origin of this branch vessel was able to be successfully catheterized with deployment of a single embolization coil resulting in branch vessel occlusion and lack of contrast extravasation following embolization. Electronically Signed   By: Irish Lack M.D.   On: 08/29/2021 08:36   IR Angiogram Selective Each Additional Vessel  Result Date: 08/29/2021 INDICATION: Lower GI bleed and hypotension with positive CT angiography demonstrating active extravasation of contrast from a branch supplying the distal transverse colon. The patient has a single current functioning IV through which she is receiving blood. There has been inability to establish a second peripheral IV. EXAM: 1. ULTRASOUND-GUIDED PERIPHERAL INTRAVENOUS ACCESS 2. ULTRASOUND GUIDANCE FOR VASCULAR ACCESS OF THE RIGHT COMMON FEMORAL ARTERY 3. SELECTIVE ARTERIOGRAPHY OF THE SUPERIOR MESENTERIC ARTERY 4. ADDITIONAL SELECTIVE ARTERIOGRAPHY OF A THIRD ORDER SMA BRANCH 5. ADDITIONAL SELECTIVE ARTERIOGRAPHY A THIRD ORDER SMA BRANCH 6. ADDITIONAL SELECTIVE ARTERIOGRAPHY A FOURTH ORDER SMA BRANCH 7. TRANSCATHETER EMBOLIZATION OF SMA BRANCH 8. FOLLOW-UP ARTERIOGRAPHY AFTER EMBOLIZATION MEDICATIONS: None ANESTHESIA/SEDATION: Moderate (conscious) sedation was employed during this procedure. A total of Versed 3.0 mg and Fentanyl 100 mcg was administered intravenously. Moderate Sedation Time: 99 minutes. The patient's level of consciousness and vital signs were monitored continuously by radiology nursing throughout the procedure under my direct supervision. CONTRAST:  75 mL Omnipaque 350 FLUOROSCOPY TIME:   Fluoroscopy Time: 33 minutes.  716 mGy COMPLICATIONS: None immediate. PROCEDURE: Informed consent was obtained from the patient's husband following explanation of the procedure, risks, benefits and alternatives. The patient and her husband understand, agree and consent for the procedure. All questions were addressed. The husband signed consent due to patient history of history of mild dementia. A time out was performed prior to  the initiation of the procedure. Maximal barrier sterile technique utilized including caps, mask, sterile gowns, sterile gloves, large sterile drape, hand hygiene, and chlorhexidine prep. Ultrasound was used to confirm patency of the right basilic vein in the upper arm. Local anesthesia was provided with 1% lidocaine. Under ultrasound guidance, a 21 gauge needle was utilized to access the vein. Ultrasound image documentation was performed. A guidewire was advanced. A 4 French micropuncture dilator was advanced into the vein and secured for use as intravenous access. Ultrasound was used to confirm patency of the right common femoral artery. Local anesthesia was provided with 1% lidocaine. Under ultrasound guidance, a 21 gauge needle was advanced into the right common femoral artery. Ultrasound image documentation was performed. After placement of a micropuncture dilator, a 5 French sheath was then placed over a guidewire. A 5 French Cobra catheter was advanced into the abdominal aorta and used to selectively catheterize the superior mesenteric artery. Selective arteriography of the superior mesenteric artery was performed in different projections. A microcatheter was then advanced through the 5 French catheter and into the trunk of the superior mesenteric artery. A third order branch vessel was catheterized and selective arteriography performed. A separate second order SMA branch was then catheterized with a microcatheter followed by further catheterization of a third order branch. Selective  arteriography was performed. The microcatheter was then used to further catheterize a fourth order branch. Selective arteriography was performed. Transcatheter embolization was performed with deployment of a single 1 mm x 2 cm [place]azm[place] LP coil. Additional follow-up arteriography was performed. After catheter removal and sheath removal, hemostasis was obtained with use of the Cordis ExoSeal device. FINDINGS: Intravenous access was successfully stab list at the level of the right basilic vein in the upper arm. This access was utilized for conscious sedation during the procedure and was also kept in place for use during hospital admission. Initial selective arteriography of the superior mesenteric artery demonstrated normal patency of branch vessels without evidence of active bleeding, pseudoaneurysm, vascular malformation or other vascular abnormality. On the third selective SMA arteriogram, there was evidence active contrast extravasation at the level of the mid to distal transverse colon in the region of bleeding identified by CT angiography. 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 After mapping out the probable route of supply to the transverse colon, a tortuous proximal branch was eventually able to be catheterized that supplies the transverse colon. Initial selective arteriography at the level of this branch did not re-demonstrate active bleeding. However, with further advancement of the catheter into the origin of a fourth order descending transverse colonic branch, active extravasation of contrast material was again demonstrated. The microcatheter could not be advanced beyond the origin of this branch vessel due to tortuosity and small vessel caliber as well as probable component of spasm. Therefore, coil embolization was performed at the base of this vessel resulting  in occlusion as well as some additional branch vessels distal to the coil. Follow-up arteriography did not demonstrate active extravasation at the level of previously demonstrated bleeding. IMPRESSION: 1. Additional peripheral intravenous access established under ultrasound guidance for use for IV conscious sedation during the procedure. 2. Arteriography of the superior mesenteric artery as well as multiple distal branch vessel supply demonstrates active contrast extravasation from a fourth order descending branch supplying the transverse colon at the level of active bleeding identified by CT  angiography. The origin of this branch vessel was able to be successfully catheterized with deployment of a single embolization coil resulting in branch vessel occlusion and lack of contrast extravasation following embolization. Electronically Signed   By: Irish Lack M.D.   On: 08/29/2021 08:36   IR Angiogram Selective Each Additional Vessel  Result Date: 08/29/2021 INDICATION: Lower GI bleed and hypotension with positive CT angiography demonstrating active extravasation of contrast from a branch supplying the distal transverse colon. The patient has a single current functioning IV through which she is receiving blood. There has been inability to establish a second peripheral IV. EXAM: 1. ULTRASOUND-GUIDED PERIPHERAL INTRAVENOUS ACCESS 2. ULTRASOUND GUIDANCE FOR VASCULAR ACCESS OF THE RIGHT COMMON FEMORAL ARTERY 3. SELECTIVE ARTERIOGRAPHY OF THE SUPERIOR MESENTERIC ARTERY 4. ADDITIONAL SELECTIVE ARTERIOGRAPHY OF A THIRD ORDER SMA BRANCH 5. ADDITIONAL SELECTIVE ARTERIOGRAPHY A THIRD ORDER SMA BRANCH 6. ADDITIONAL SELECTIVE ARTERIOGRAPHY A FOURTH ORDER SMA BRANCH 7. TRANSCATHETER EMBOLIZATION OF SMA BRANCH 8. FOLLOW-UP ARTERIOGRAPHY AFTER EMBOLIZATION MEDICATIONS: None ANESTHESIA/SEDATION: Moderate (conscious) sedation was employed during this procedure. A total of Versed 3.0 mg and Fentanyl 100 mcg was administered  intravenously. Moderate Sedation Time: 99 minutes. The patient's level of consciousness and vital signs were monitored continuously by radiology nursing throughout the procedure under my direct supervision. CONTRAST:  75 mL Omnipaque 350 FLUOROSCOPY TIME:  Fluoroscopy Time: 33 minutes.  716 mGy COMPLICATIONS: None immediate. PROCEDURE: Informed consent was obtained from the patient's husband following explanation of the procedure, risks, benefits and alternatives. The patient and her husband understand, agree and consent for the procedure. All questions were addressed. The husband signed consent due to patient history of history of mild dementia. A time out was performed prior to the initiation of the procedure. Maximal barrier sterile technique utilized including caps, mask, sterile gowns, sterile gloves, large sterile drape, hand hygiene, and chlorhexidine prep. Ultrasound was used to confirm patency of the right basilic vein in the upper arm. Local anesthesia was provided with 1% lidocaine. Under ultrasound guidance, a 21 gauge needle was utilized to access the vein. Ultrasound image documentation was performed. A guidewire was advanced. A 4 French micropuncture dilator was advanced into the vein and secured for use as intravenous access. Ultrasound was used to confirm patency of the right common femoral artery. Local anesthesia was provided with 1% lidocaine. Under ultrasound guidance, a 21 gauge needle was advanced into the right common femoral artery. Ultrasound image documentation was performed. After placement of a micropuncture dilator, a 5 French sheath was then placed over a guidewire. A 5 French Cobra catheter was advanced into the abdominal aorta and used to selectively catheterize the superior mesenteric artery. Selective arteriography of the superior mesenteric artery was performed in different projections. A microcatheter was then advanced through the 5 French catheter and into the trunk of the  superior mesenteric artery. A third order branch vessel was catheterized and selective arteriography performed. A separate second order SMA branch was then catheterized with a microcatheter followed by further catheterization of a third order branch. Selective arteriography was performed. The microcatheter was then used to further catheterize a fourth order branch. Selective arteriography was performed. Transcatheter embolization was performed with deployment of a single 1 mm x 2 cm [place] LP coil. Additional follow-up arteriography was performed. After catheter removal and sheath removal, hemostasis was obtained with use of the Cordis ExoSeal device. FINDINGS: Intravenous access was successfully stab list at the level of the right basilic vein in the upper arm. This access was utilized  for conscious sedation during the procedure and was also kept in place for use during hospital admission. Initial selective arteriography of the superior mesenteric artery demonstrated normal patency of branch vessels without evidence of active bleeding, pseudoaneurysm, vascular malformation or other vascular abnormality. On the third selective SMA arteriogram, there was evidence active contrast extravasation at the level of the mid to distal transverse colon in the region of bleeding identified by CT angiography. 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 After mapping out the probable route of supply to the transverse colon, a tortuous proximal branch was eventually able to be catheterized that supplies the transverse colon. Initial selective arteriography at the level of this branch did not re-demonstrate active bleeding. However, with further advancement of the catheter into the origin of a fourth order descending transverse colonic branch, active extravasation of contrast material was  again demonstrated. The microcatheter could not be advanced beyond the origin of this branch vessel due to tortuosity and small vessel caliber as well as probable component of spasm. Therefore, coil embolization was performed at the base of this vessel resulting in occlusion as well as some additional branch vessels distal to the coil. Follow-up arteriography did not demonstrate active extravasation at the level of previously demonstrated bleeding. IMPRESSION: 1. Additional peripheral intravenous access established under ultrasound guidance for use for IV conscious sedation during the procedure. 2. Arteriography of the superior mesenteric artery as well as multiple distal branch vessel supply demonstrates active contrast extravasation from a fourth order descending branch supplying the transverse colon at the level of active bleeding identified by CT angiography. The origin of this branch vessel was able to be successfully catheterized with deployment of a single embolization coil resulting in branch vessel occlusion and lack of contrast extravasation following embolization. Electronically Signed   By: Irish Lack M.D.   On: 08/29/2021 08:36   IR Angiogram Follow Up Study  Result Date: 08/29/2021 INDICATION: Lower GI bleed and hypotension with positive CT angiography demonstrating active extravasation of contrast from a branch supplying the distal transverse colon. The patient has a single current functioning IV through which she is receiving blood. There has been inability to establish a second peripheral IV. EXAM: 1. ULTRASOUND-GUIDED PERIPHERAL INTRAVENOUS ACCESS 2. ULTRASOUND GUIDANCE FOR VASCULAR ACCESS OF THE RIGHT COMMON FEMORAL ARTERY 3. SELECTIVE ARTERIOGRAPHY OF THE SUPERIOR MESENTERIC ARTERY 4. ADDITIONAL SELECTIVE ARTERIOGRAPHY OF A THIRD ORDER SMA BRANCH 5. ADDITIONAL SELECTIVE ARTERIOGRAPHY A THIRD ORDER SMA BRANCH 6. ADDITIONAL SELECTIVE ARTERIOGRAPHY A FOURTH ORDER SMA BRANCH 7. TRANSCATHETER  EMBOLIZATION OF SMA BRANCH 8. FOLLOW-UP ARTERIOGRAPHY AFTER EMBOLIZATION MEDICATIONS: None ANESTHESIA/SEDATION: Moderate (conscious) sedation was employed during this procedure. A total of Versed 3.0 mg and Fentanyl 100 mcg was administered intravenously. Moderate Sedation Time: 99 minutes. The patient's level of consciousness and vital signs were monitored continuously by radiology nursing throughout the procedure under my direct supervision. CONTRAST:  75 mL Omnipaque 350 FLUOROSCOPY TIME:  Fluoroscopy Time: 33 minutes.  716 mGy COMPLICATIONS: None immediate. PROCEDURE: Informed consent was obtained from the patient's husband following explanation of the procedure, risks, benefits and alternatives. The patient and her husband understand, agree and consent for the procedure. All questions were addressed. The husband signed consent due to patient history of history of mild dementia. A time out was performed prior to the initiation of the procedure. Maximal barrier sterile technique utilized including caps, mask,  sterile gowns, sterile gloves, large sterile drape, hand hygiene, and chlorhexidine prep. Ultrasound was used to confirm patency of the right basilic vein in the upper arm. Local anesthesia was provided with 1% lidocaine. Under ultrasound guidance, a 21 gauge needle was utilized to access the vein. Ultrasound image documentation was performed. A guidewire was advanced. A 4 French micropuncture dilator was advanced into the vein and secured for use as intravenous access. Ultrasound was used to confirm patency of the right common femoral artery. Local anesthesia was provided with 1% lidocaine. Under ultrasound guidance, a 21 gauge needle was advanced into the right common femoral artery. Ultrasound image documentation was performed. After placement of a micropuncture dilator, a 5 French sheath was then placed over a guidewire. A 5 French Cobra catheter was advanced into the abdominal aorta and used to  selectively catheterize the superior mesenteric artery. Selective arteriography of the superior mesenteric artery was performed in different projections. A microcatheter was then advanced through the 5 French catheter and into the trunk of the superior mesenteric artery. A third order branch vessel was catheterized and selective arteriography performed. A separate second order SMA branch was then catheterized with a microcatheter followed by further catheterization of a third order branch. Selective arteriography was performed. The microcatheter was then used to further catheterize a fourth order branch. Selective arteriography was performed. Transcatheter embolization was performed with deployment of a single 1 mm x 2 cm Lyberti LP coil. Additional follow-up arteriography was performed. After catheter removal and sheath removal, hemostasis was obtained with use of the Cordis ExoSeal device. FINDINGS: Intravenous access was successfully stab list at the level of the right basilic vein in the upper arm. This access was utilized for conscious sedation during the procedure and was also kept in place for use during hospital admission. Initial selective arteriography of the superior mesenteric artery demonstrated normal patency of branch vessels without evidence of active bleeding, pseudoaneurysm, vascular malformation or other vascular abnormality. On the third selective SMA arteriogram, there was evidence active contrast extravasation at the level of the mid to distal transverse colon in the region of bleeding identified by CT angiography. 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 After mapping out the probable route of supply to the transverse colon, a tortuous proximal branch was eventually able to be catheterized that supplies the transverse colon. Initial selective  arteriography at the level of this branch did not re-demonstrate active bleeding. However, with further advancement of the catheter into the origin of a fourth order descending transverse colonic branch, active extravasation of contrast material was again demonstrated. The microcatheter could not be advanced beyond the origin of this branch vessel due to tortuosity and small vessel caliber as well as probable component of spasm. Therefore, coil embolization was performed at the base of this vessel resulting in occlusion as well as some additional branch vessels distal to the coil. Follow-up arteriography did not demonstrate active extravasation at the level of previously demonstrated bleeding. IMPRESSION: 1. Additional peripheral intravenous access established under ultrasound guidance for use for IV conscious sedation during the procedure. 2. Arteriography of the superior mesenteric artery as well as multiple distal branch vessel supply demonstrates active contrast extravasation from a fourth order descending branch supplying the transverse colon at the level of active bleeding identified by CT angiography. The origin of this branch vessel was able to be successfully catheterized  with deployment of a single embolization coil resulting in branch vessel occlusion and lack of contrast extravasation following embolization. Electronically Signed   By: Irish Lack M.D.   On: 08/29/2021 08:36   IR US Guide Vasc Access Right  Result Date: 08/29/2021 INDICATION: Lower GI bleed and hypotension with positive CT angiography demonstrating active extravasation of contrast from a branch supplying the distal transverse colon. The patient has a single current functioning IV through which she is receiving blood. There has been inability to establish a second peripheral IV. EXAM: 1. ULTRASOUND-GUIDED PERIPHERAL INTRAVENOUS ACCESS 2. ULTRASOUND GUIDANCE FOR VASCULAR ACCESS OF THE RIGHT COMMON FEMORAL ARTERY 3. SELECTIVE  ARTERIOGRAPHY OF THE SUPERIOR MESENTERIC ARTERY 4. ADDITIONAL SELECTIVE ARTERIOGRAPHY OF A THIRD ORDER SMA BRANCH 5. ADDITIONAL SELECTIVE ARTERIOGRAPHY A THIRD ORDER SMA BRANCH 6. ADDITIONAL SELECTIVE ARTERIOGRAPHY A FOURTH ORDER SMA BRANCH 7. TRANSCATHETER EMBOLIZATION OF SMA BRANCH 8. FOLLOW-UP ARTERIOGRAPHY AFTER EMBOLIZATION MEDICATIONS: None ANESTHESIA/SEDATION: Moderate (conscious) sedation was employed during this procedure. A total of Versed 3.0 mg and Fentanyl 100 mcg was administered intravenously. Moderate Sedation Time: 99 minutes. The patient's level of consciousness and vital signs were monitored continuously by radiology nursing throughout the procedure under my direct supervision. CONTRAST:  75 mL Omnipaque 350 FLUOROSCOPY TIME:  Fluoroscopy Time: 33 minutes.  716 mGy COMPLICATIONS: None immediate. PROCEDURE: Informed consent was obtained from the patient's husband following explanation of the procedure, risks, benefits and alternatives. The patient and her husband understand, agree and consent for the procedure. All questions were addressed. The husband signed consent due to patient history of history of mild dementia. A time out was performed prior to the initiation of the procedure. Maximal barrier sterile technique utilized including caps, mask, sterile gowns, sterile gloves, large sterile drape, hand hygiene, and chlorhexidine prep. Ultrasound was used to confirm patency of the right basilic vein in the upper arm. Local anesthesia was provided with 1% lidocaine. Under ultrasound guidance, a 21 gauge needle was utilized to access the vein. Ultrasound image documentation was performed. A guidewire was advanced. A 4 French micropuncture dilator was advanced into the vein and secured for use as intravenous access. Ultrasound was used to confirm patency of the right common femoral artery. Local anesthesia was provided with 1% lidocaine. Under ultrasound guidance, a 21 gauge needle was advanced into  the right common femoral artery. Ultrasound image documentation was performed. After placement of a micropuncture dilator, a 5 French sheath was then placed over a guidewire. A 5 French Cobra catheter was advanced into the abdominal aorta and used to selectively catheterize the superior mesenteric artery. Selective arteriography of the superior mesenteric artery was performed in different projections. A microcatheter was then advanced through the 5 French catheter and into the trunk of the superior mesenteric artery. A third order branch vessel was catheterized and selective arteriography performed. A separate second order SMA branch was then catheterized with a microcatheter followed by further catheterization of a third order branch. Selective arteriography was performed. The microcatheter was then used to further catheterize a fourth order branch. Selective arteriography was performed. Transcatheter embolization was performed with deployment of a single 1 mm x 2 cm Dedra LP coil. Additional follow-up arteriography was performed. After catheter removal and sheath removal, hemostasis was obtained with use of the Cordis ExoSeal device. FINDINGS: Intravenous access was successfully stab list at the level of the right basilic vein in the upper arm. This access was utilized for conscious sedation during the procedure and was also kept in place for  use during hospital admission. Initial selective arteriography of the superior mesenteric artery demonstrated normal patency of branch vessels without evidence of active bleeding, pseudoaneurysm, vascular malformation or other vascular abnormality. On the third selective SMA arteriogram, there was evidence active contrast extravasation at the level of the mid to distal transverse colon in the region of bleeding identified by CT angiography. Initial catheterization of a branch vessel with a microcatheter was at the level a small bowel branch which demonstrates a small focal  branch vessel aneurysm without evidence of hemorrhage. After mapping out the probable route of supply to the transverse colon, a tortuous proximal branch was eventually able to be catheterized that supplies the transverse colon. Initial selective arteriography at the level of this branch did not re-demonstrate active bleeding. However, with further advancement of the catheter into the origin of a fourth order descending transverse colonic branch, active extravasation of contrast material was again demonstrated. The microcatheter could not be advanced beyond the origin of this branch vessel due to tortuosity and small vessel caliber as well as probable component of spasm. Therefore, coil embolization was performed at the base of this vessel resulting in occlusion as well as some additional branch vessels distal to the coil. Follow-up arteriography did not demonstrate active extravasation at the level of previously demonstrated bleeding. IMPRESSION: 1. Additional peripheral intravenous access established under ultrasound guidance for use for IV conscious sedation during the procedure. 2. Arteriography of the superior mesenteric artery as well as multiple distal branch vessel supply demonstrates active contrast extravasation from a fourth order descending branch supplying the transverse colon at the level of active bleeding identified by CT angiography. The origin of this branch vessel was able to be successfully catheterized with deployment of a single embolization coil resulting in branch vessel occlusion and lack of contrast extravasation following embolization. Electronically Signed   By: Irish Lack M.D.   On: 08/29/2021 08:36   IR US Guide Vasc Access Right  Result Date: 08/29/2021 INDICATION: Lower GI bleed and hypotension with positive CT angiography demonstrating active extravasation of contrast from a branch supplying the distal transverse colon. The patient has a single current functioning IV through  which she is receiving blood. There has been inability to establish a second peripheral IV. EXAM: 1. ULTRASOUND-GUIDED PERIPHERAL INTRAVENOUS ACCESS 2. ULTRASOUND GUIDANCE FOR VASCULAR ACCESS OF THE RIGHT COMMON FEMORAL ARTERY 3. SELECTIVE ARTERIOGRAPHY OF THE SUPERIOR MESENTERIC ARTERY 4. ADDITIONAL SELECTIVE ARTERIOGRAPHY OF A THIRD ORDER SMA BRANCH 5. ADDITIONAL SELECTIVE ARTERIOGRAPHY A THIRD ORDER SMA BRANCH 6. ADDITIONAL SELECTIVE ARTERIOGRAPHY A FOURTH ORDER SMA BRANCH 7. TRANSCATHETER EMBOLIZATION OF SMA BRANCH 8. FOLLOW-UP ARTERIOGRAPHY AFTER EMBOLIZATION MEDICATIONS: None ANESTHESIA/SEDATION: Moderate (conscious) sedation was employed during this procedure. A total of Versed 3.0 mg and Fentanyl 100 mcg was administered intravenously. Moderate Sedation Time: 99 minutes. The patient's level of consciousness and vital signs were monitored continuously by radiology nursing throughout the procedure under my direct supervision. CONTRAST:  75 mL Omnipaque 350 FLUOROSCOPY TIME:  Fluoroscopy Time: 33 minutes.  716 mGy COMPLICATIONS: None immediate. PROCEDURE: Informed consent was obtained from the patient's husband following explanation of the procedure, risks, benefits and alternatives. The patient and her husband understand, agree and consent for the procedure. All questions were addressed. The husband signed consent due to patient history of history of mild dementia. A time out was performed prior to the initiation of the procedure. Maximal barrier sterile technique utilized including caps, mask, sterile gowns, sterile gloves, large sterile drape, hand hygiene, and chlorhexidine prep.  Ultrasound was used to confirm patency of the right basilic vein in the upper arm. Local anesthesia was provided with 1% lidocaine. Under ultrasound guidance, a 21 gauge needle was utilized to access the vein. Ultrasound image documentation was performed. A guidewire was advanced. A 4 French micropuncture dilator was advanced into  the vein and secured for use as intravenous access. Ultrasound was used to confirm patency of the right common femoral artery. Local anesthesia was provided with 1% lidocaine. Under ultrasound guidance, a 21 gauge needle was advanced into the right common femoral artery. Ultrasound image documentation was performed. After placement of a micropuncture dilator, a 5 French sheath was then placed over a guidewire. A 5 French Cobra catheter was advanced into the abdominal aorta and used to selectively catheterize the superior mesenteric artery. Selective arteriography of the superior mesenteric artery was performed in different projections. A microcatheter was then advanced through the 5 French catheter and into the trunk of the superior mesenteric artery. A third order branch vessel was catheterized and selective arteriography performed. A separate second order SMA branch was then catheterized with a microcatheter followed by further catheterization of a third order branch. Selective arteriography was performed. The microcatheter was then used to further catheterize a fourth order branch. Selective arteriography was performed. Transcatheter embolization was performed with deployment of a single 1 mm x 2 cm Ma[place] LP coil. Additional follow-up arteriography was performed. After catheter removal and sheath removal, hemostasis was obtained with use of the Cordis ExoSeal device. FINDINGS: Intravenous access was successfully stab list at the level of the right basilic vein in the upper arm. This access was utilized for conscious sedation during the procedure and was also kept in place for use during hospital admission. Initial selective arteriography of the superior mesenteric artery demonstrated normal patency of branch vessels without evidence of active bleeding, pseudoaneurysm, vascular malformation or other vascular abnormality. On the third selective SMA arteriogram, there was evidence active contrast extravasation at the  level of the mid to distal transverse colon in the region of bleeding identified by CT angiography. 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 After mapping out the probable route of supply to the transverse colon, a tortuous proximal branch was eventually able to be catheterized that supplies the transverse colon. Initial selective arteriography at the level of this branch did not re-demonstrate active bleeding. However, with further advancement of the catheter into the origin of a fourth order descending transverse colonic branch, active extravasation of contrast material was again demonstrated. The microcatheter could not be advanced beyond the origin of this branch vessel due to tortuosity and small vessel caliber as well as probable component of spasm. Therefore, coil embolization was performed at the base of this vessel resulting in occlusion as well as some additional branch vessels distal to the coil. Follow-up arteriography did not demonstrate active extravasation at the level of previously demonstrated bleeding. IMPRESSION: 1. Additional peripheral intravenous access established under ultrasound guidance for use for IV conscious sedation during the procedure. 2. Arteriography of the superior mesenteric artery as well as multiple distal branch vessel supply demonstrates active contrast extravasation from a fourth order descending branch supplying the transverse colon at the level of active bleeding identified by CT angiography. The origin of this branch vessel was able to be successfully catheterized with deployment of a single embolization coil resulting in branch vessel occlusion  and lack of contrast extravasation following embolization. Electronically Signed   By: Irish Lack M.D.   On: 08/29/2021 08:36   IR RADIOLOGY PERIPHERAL GUIDED IV START  Result  Date: 08/29/2021 INDICATION: Lower GI bleed and hypotension with positive CT angiography demonstrating active extravasation of contrast from a branch supplying the distal transverse colon. The patient has a single current functioning IV through which she is receiving blood. There has been inability to establish a second peripheral IV. EXAM: 1. ULTRASOUND-GUIDED PERIPHERAL INTRAVENOUS ACCESS 2. ULTRASOUND GUIDANCE FOR VASCULAR ACCESS OF THE RIGHT COMMON FEMORAL ARTERY 3. SELECTIVE ARTERIOGRAPHY OF THE SUPERIOR MESENTERIC ARTERY 4. ADDITIONAL SELECTIVE ARTERIOGRAPHY OF A THIRD ORDER SMA BRANCH 5. ADDITIONAL SELECTIVE ARTERIOGRAPHY A THIRD ORDER SMA BRANCH 6. ADDITIONAL SELECTIVE ARTERIOGRAPHY A FOURTH ORDER SMA BRANCH 7. TRANSCATHETER EMBOLIZATION OF SMA BRANCH 8. FOLLOW-UP ARTERIOGRAPHY AFTER EMBOLIZATION MEDICATIONS: None ANESTHESIA/SEDATION: Moderate (conscious) sedation was employed during this procedure. A total of Versed 3.0 mg and Fentanyl 100 mcg was administered intravenously. Moderate Sedation Time: 99 minutes. The patient's level of consciousness and vital signs were monitored continuously by radiology nursing throughout the procedure under my direct supervision. CONTRAST:  75 mL Omnipaque 350 FLUOROSCOPY TIME:  Fluoroscopy Time: 33 minutes.  716 mGy COMPLICATIONS: None immediate. PROCEDURE: Informed consent was obtained from the patient's husband following explanation of the procedure, risks, benefits and alternatives. The patient and her husband understand, agree and consent for the procedure. All questions were addressed. The husband signed consent due to patient history of history of mild dementia. A time out was performed prior to the initiation of the procedure. Maximal barrier sterile technique utilized including caps, mask, sterile gowns, sterile gloves, large sterile drape, hand hygiene, and chlorhexidine prep. Ultrasound was used to confirm patency of the right basilic vein in the upper arm. Local  anesthesia was provided with 1% lidocaine. Under ultrasound guidance, a 21 gauge needle was utilized to access the vein. Ultrasound image documentation was performed. A guidewire was advanced. A 4 French micropuncture dilator was advanced into the vein and secured for use as intravenous access. Ultrasound was used to confirm patency of the right common femoral artery. Local anesthesia was provided with 1% lidocaine. Under ultrasound guidance, a 21 gauge needle was advanced into the right common femoral artery. Ultrasound image documentation was performed. After placement of a micropuncture dilator, a 5 French sheath was then placed over a guidewire. A 5 French Cobra catheter was advanced into the abdominal aorta and used to selectively catheterize the superior mesenteric artery. Selective arteriography of the superior mesenteric artery was performed in different projections. A microcatheter was then advanced through the 5 French catheter and into the trunk of the superior mesenteric artery. A third order branch vessel was catheterized and selective arteriography performed. A separate second order SMA branch was then catheterized with a microcatheter followed by further catheterization of a third order branch. Selective arteriography was performed. The microcatheter was then used to further catheterize a fourth order branch. Selective arteriography was performed. Transcatheter embolization was performed with deployment of a single 1 mm x 2 cm Audianna LP coil. Additional follow-up arteriography was performed. After catheter removal and sheath removal, hemostasis was obtained with use of the Cordis ExoSeal device. FINDINGS: Intravenous access was successfully stab list at the level of the right basilic vein in the upper arm. This access was utilized for conscious sedation during the procedure and was also kept in place for use during hospital admission. Initial selective arteriography of the superior mesenteric artery  demonstrated normal patency of branch vessels without evidence of active bleeding, pseudoaneurysm, vascular malformation or other vascular abnormality. On the third selective SMA arteriogram, there was evidence active contrast extravasation at the level of the mid to distal transverse colon in the region of bleeding identified by CT angiography. 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 After mapping out the probable route of supply to the transverse colon, a tortuous proximal branch was eventually able to be catheterized that supplies the transverse colon. Initial selective arteriography at the level of this branch did not re-demonstrate active bleeding. However, with further advancement of the catheter into the origin of a fourth order descending transverse colonic branch, active extravasation of contrast material was again demonstrated. The microcatheter could not be advanced beyond the origin of this branch vessel due to tortuosity and small vessel caliber as well as probable component of spasm. Therefore, coil embolization was performed at the base of this vessel resulting in occlusion as well as some additional branch vessels distal to the coil. Follow-up arteriography did not demonstrate active extravasation at the level of previously demonstrated bleeding. IMPRESSION: 1. Additional peripheral intravenous access established under ultrasound guidance for use for IV conscious sedation during the procedure. 2. Arteriography of the superior mesenteric artery as well as multiple distal branch vessel supply demonstrates active contrast extravasation from a fourth order descending branch supplying the transverse colon at the level of active bleeding identified by CT angiography. The origin of this branch vessel was able to be successfully catheterized with deployment of  a single embolization coil resulting in branch vessel occlusion and lack of contrast extravasation following embolization. Electronically Signed   By: Irish Lack M.D.   On: 08/29/2021 08:36   IR EMBO ART  VEN HEMORR LYMPH EXTRAV  INC GUIDE ROADMAPPING  Result Date: 08/29/2021 INDICATION: Lower GI bleed and hypotension with positive CT angiography demonstrating active extravasation of contrast from a branch supplying the distal transverse colon. The patient has a single current functioning IV through which she is receiving blood. There has been inability to establish a second peripheral IV. EXAM: 1. ULTRASOUND-GUIDED PERIPHERAL INTRAVENOUS ACCESS 2. ULTRASOUND GUIDANCE FOR VASCULAR ACCESS OF THE RIGHT COMMON FEMORAL ARTERY 3. SELECTIVE ARTERIOGRAPHY OF THE SUPERIOR MESENTERIC ARTERY 4. ADDITIONAL SELECTIVE ARTERIOGRAPHY OF A THIRD ORDER SMA BRANCH 5. ADDITIONAL SELECTIVE ARTERIOGRAPHY A THIRD ORDER SMA BRANCH 6. ADDITIONAL SELECTIVE ARTERIOGRAPHY A FOURTH ORDER SMA BRANCH 7. TRANSCATHETER EMBOLIZATION OF SMA BRANCH 8. FOLLOW-UP ARTERIOGRAPHY AFTER EMBOLIZATION MEDICATIONS: None ANESTHESIA/SEDATION: Moderate (conscious) sedation was employed during this procedure. A total of Versed 3.0 mg and Fentanyl 100 mcg was administered intravenously. Moderate Sedation Time: 99 minutes. The patient's level of consciousness and vital signs were monitored continuously by radiology nursing throughout the procedure under my direct supervision. CONTRAST:  75 mL Omnipaque 350 FLUOROSCOPY TIME:  Fluoroscopy Time: 33 minutes.  716 mGy COMPLICATIONS: None immediate. PROCEDURE: Informed consent was obtained from the patient's husband following explanation of the procedure, risks, benefits and alternatives. The patient and her husband understand, agree and consent for the procedure. All questions were addressed. The husband signed consent due to patient history of history of mild dementia. A time out was performed prior to the  initiation of the procedure. Maximal barrier sterile technique utilized including caps, mask, sterile gowns, sterile gloves, large sterile drape, hand hygiene, and chlorhexidine prep. Ultrasound was used to confirm patency  of the right basilic vein in the upper arm. Local anesthesia was provided with 1% lidocaine. Under ultrasound guidance, a 21 gauge needle was utilized to access the vein. Ultrasound image documentation was performed. A guidewire was advanced. A 4 French micropuncture dilator was advanced into the vein and secured for use as intravenous access. Ultrasound was used to confirm patency of the right common femoral artery. Local anesthesia was provided with 1% lidocaine. Under ultrasound guidance, a 21 gauge needle was advanced into the right common femoral artery. Ultrasound image documentation was performed. After placement of a micropuncture dilator, a 5 French sheath was then placed over a guidewire. A 5 French Cobra catheter was advanced into the abdominal aorta and used to selectively catheterize the superior mesenteric artery. Selective arteriography of the superior mesenteric artery was performed in different projections. A microcatheter was then advanced through the 5 French catheter and into the trunk of the superior mesenteric artery. A third order branch vessel was catheterized and selective arteriography performed. A separate second order SMA branch was then catheterized with a microcatheter followed by further catheterization of a third order branch. Selective arteriography was performed. The microcatheter was then used to further catheterize a fourth order branch. Selective arteriography was performed. Transcatheter embolization was performed with deployment of a single 1 mm x 2 cm Clydette LP coil. Additional follow-up arteriography was performed. After catheter removal and sheath removal, hemostasis was obtained with use of the Cordis ExoSeal device. FINDINGS: Intravenous access was  successfully stab list at the level of the right basilic vein in the upper arm. This access was utilized for conscious sedation during the procedure and was also kept in place for use during hospital admission. Initial selective arteriography of the superior mesenteric artery demonstrated normal patency of branch vessels without evidence of active bleeding, pseudoaneurysm, vascular malformation or other vascular abnormality. On the third selective SMA arteriogram, there was evidence active contrast extravasation at the level of the mid to distal transverse colon in the region of bleeding identified by CT angiography. 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 After mapping out the probable route of supply to the transverse colon, a tortuous proximal branch was eventually able to be catheterized that supplies the transverse colon. Initial selective arteriography at the level of this branch did not re-demonstrate active bleeding. However, with further advancement of the catheter into the origin of a fourth order descending transverse colonic branch, active extravasation of contrast material was again demonstrated. The microcatheter could not be advanced beyond the origin of this branch vessel due to tortuosity and small vessel caliber as well as probable component of spasm. Therefore, coil embolization was performed at the base of this vessel resulting in occlusion as well as some additional branch vessels distal to the coil. Follow-up arteriography did not demonstrate active extravasation at the level of previously demonstrated bleeding. IMPRESSION: 1. Additional peripheral intravenous access established under ultrasound guidance for use for IV conscious sedation during the procedure. 2. Arteriography of the superior mesenteric artery as well as multiple distal branch  vessel supply demonstrates active contrast extravasation from a fourth order descending branch supplying the transverse colon at the level of active bleeding identified by CT angiography. The origin of this branch vessel was able to be successfully catheterized with deployment of a single embolization coil resulting in branch vessel occlusion and lack of contrast extravasation following  embolization. Electronically Signed   By: Irish Lack M.D.   On: 08/29/2021 08:36   CT ANGIO GI BLEED  Result Date: 08/27/2021 CLINICAL DATA:  GI bleed. EXAM: CTA ABDOMEN AND PELVIS WITHOUT AND WITH CONTRAST TECHNIQUE: Multidetector CT imaging of the abdomen and pelvis was performed using the standard protocol during bolus administration of intravenous contrast. Multiplanar reconstructed images and MIPs were obtained and reviewed to evaluate the vascular anatomy. CONTRAST:  OMNIPAQUE IOHEXOL 350 MG/ML SOLN COMPARISON:  Abdominal CTA 2 days ago 08/25/2021 FINDINGS: VASCULAR Aorta: Mild atherosclerosis. No aneurysm, dissection, or significant stenosis. No periaortic stranding or inflammation. Celiac: The right hepatic artery has a separate origin arising from the abdominal aorta. Celiac artery is patent. Distal branch vessels are patent. SMA: Patent without evidence of aneurysm, dissection, vasculitis or significant stenosis. Renals: Mild calcified plaque in the origin of the renal arteries without significant stenosis. No acute findings or change from prior. Single bilateral renal arteries. IMA: Patent. Inflow: Patent without evidence of aneurysm, dissection, vasculitis or significant stenosis. Iliac tortuosity again seen. Proximal Outflow: Bilateral common femoral and visualized portions of the superficial and profunda femoral arteries are patent without evidence of aneurysm, dissection, vasculitis or significant stenosis. Veins: No obvious venous abnormality within the limitations of this arterial phase study. Review of  the MIP images confirms the above findings. NON-VASCULAR Lower chest: Lung bases are clear. No acute airspace disease or pleural effusion. Hepatobiliary: No focal hepatic abnormality. There is vicarious excretion of IV contrast in the gallbladder accounting for hyperdense appearance on the current exam. No biliary dilatation or pericholecystic inflammation. Pancreas: No ductal dilatation or inflammation. Spleen: Normal in size without focal abnormality. Adrenals/Urinary Tract: No adrenal nodule. Homogeneous renal enhancement without hydronephrosis. Again seen right renal cysts. No suspicious renal lesion. Unremarkable urinary bladder. Stomach/Bowel: Examination is positive for active extravasation of contrast into the GI tract. There is extravasation of contrast into the mid transverse colon, series 10, image 94, that increases on venous phase. No additional sites of IV contrast extravasation. Diffuse colonic diverticulosis without diverticulitis. Normal appendix. Lymphatic: No abdominopelvic adenopathy. Reproductive: Partial hysterectomy with prominence of the vaginal cuff unchanged. No adnexal mass. Other: No ascites or free air. Small fat containing umbilical hernia. Small bilateral fat containing inguinal hernias. Musculoskeletal: There are no acute or suspicious osseous abnormalities. Transitional lumbosacral anatomy with enlarged transverse processes and pseudoarticulation of the sacrum with L5. Mild bilateral hip osteoarthritis. IMPRESSION: VASCULAR 1. Examination is positive for active GI bleed in the mid distal transverse colon. 2. Mild aortic atherosclerosis. NON-VASCULAR 1. Colonic diverticulosis without focal diverticulitis. 2. Small fat containing bilateral inguinal and umbilical hernias. Electronically Signed   By: Narda Rutherford M.D.   On: 08/27/2021 21:31    Labs:  CBC: Recent Labs    08/25/21 0228 08/25/21 1125 08/26/21 0731 08/26/21 1311 08/27/21 2035 08/28/21 0549 08/28/21 1450  08/29/21 0328  WBC 9.9  --  8.1  --   --   --  17.1* 14.3*  HGB 8.4*   < > 7.2*   < > 7.7* 8.5* 7.7* 6.3*  HCT 26.1*   < > 22.4*   < > 23.6* 25.8* 23.3* 19.3*  PLT 211  --  207  --   --   --  134* 135*   < > = values in this interval not displayed.    COAGS: Recent Labs    08/24/21 1450  INR 1.1    BMP: Recent Labs    08/25/21 0228 08/25/21 1844  08/26/21 0647 08/28/21 1450 08/29/21 0328  NA 139  --  137 139 138  K 4.2  --  3.5 3.9 3.6  CL 107  --  108 109 106  CO2 24  --  23 25 24   GLUCOSE 101*  --  114* 145* 125*  BUN 12 8 5* 11 13  CALCIUM 9.1  --  8.6* 8.3* 8.4*  CREATININE 0.53  --  0.65 0.80 0.73  GFRNONAA >60  --  >60 >60 >60    LIVER FUNCTION TESTS: Recent Labs    08/24/21 1450 08/26/21 0647  BILITOT 0.6 0.1*  AST 17 18  ALT 10 10  ALKPHOS 33* 30*  PROT 6.2* 5.6*  ALBUMIN 3.2* 2.8*    Assessment and Plan:  75 y.o. female inpatient. History of pan diverticulosis. Presented to the ED on 9.2.22 with 3 days of hematochezia.Initially OCTA performed on  9.3.22 negative for active extravasation. The Patient began to re bleed on 9.5.22 and second OCTA performed on 9.5.22 reads Examination is positive for active GI bleed in the mid distal transverse colon. IR performed an arteriogram on 9.6.22 with catheterization and single coil embolization of the fourth order descending branch supplying the transverse colon. Patient has denies any bowel movements but has had a drop in hemoglobin to 6.6 and has since had 2 units of PR BC transfused. Post transfusion H&H pending.  Hgb 6.3 >> 7.7 >> 8.5. WBC 14.3 (down trending). Right femoral access site is soft with no active bleeding and no appreciable pseudoaneurysm. Dressing is C/D/I. Site is soft with no active bleeding and no appreciable pseudoaneurysm. Dressing is C/D/I    IR will continue to follow along - plans per Primary Team   Electronically Signed: Alene MiresJennifer C Genean Adamski, NP 08/29/2021, 3:10 PM   I spent a total  of 15 Minutes at the patient's bedside AND on the patient's hospital floor or unit, greater than 50% of which was counseling/coordinating care for intra abdominal angiogram with emobolization on 9.6.22

## 2021-08-29 NOTE — Progress Notes (Signed)
Notified by RN that Hgb is 6.3 this am.  Was 7.7 yesterday afternoon.  Pt reports no further blood in stools.  Had IR procedure for bleeding in colon yesterday. Transfuse 2 units now. Check Hgb/Hct Q 6 hours.

## 2021-08-30 LAB — PATHOLOGIST SMEAR REVIEW

## 2021-08-30 NOTE — Progress Notes (Signed)
Spoke with MD Margo Aye regarding pts bloody stools-pt will stay for another night and be discharged possibly tomorrow-pt and family made aware

## 2021-08-30 NOTE — Progress Notes (Signed)
PROGRESS NOTE  Doris Boyd TIR:[id number] DOB: 1946/02/11 DOA: 08/24/2021 PCP: Marden Noble, MD  HPI/Recap of past 24 hours: Doris Boyd is a 75 y.o. female with a history of pandiverticulosis who takes no medications and presented to the ED 9/2 with 3 days of painless red blood in stool turning more dark over time. In the ED she was orthostatic with hgb 8.1g/dl (from 68Y/PP on 5/09), +FOBT, so GI was consulted, 1u PRBCs transfused, and patient admitted. Bleeding initially seemed to improve and CTA showed no source of bleeding. Unfortunately, she has again developed frequent stools characterized as dark and red bloody. PPI was started empirically and pt made NPO. An additional unit of blood was given 9/4. Over the course of 9/5, hematochezia returned. 2 more units of PRBCs transfused. CTA repeated and positive for bleeding in mid-transverse colon. IR subsequently performed embolization of branch vessel of SMA supplying distal transverse colon.   08/29/2021: Seen and examined at her bedside.  Her husband was present in the room.  She reported generalized weakness and fatigue.  Blood being transfused in the room.  Hemoglobin 6.3 this morning, 2 unit PRBCs ordered to be transfused.  She denies any bowel movements since waking up this morning, at the time of this visit.  08/30/2021: She has recurrent GI bleed this afternoon reported 2 bloody stools.  Held off her discharge.  We will monitor overnight and repeat CBC in the morning.  Assessment/Plan: Principal Problem:   GI bleed Active Problems:   Acute blood loss anemia  Acute blood loss anemia due transverse colon diverticular hemorrhage, pandiverticulosis:  s/p coil embolization of supplying SMA branch on 9/6 by IR.  - Trend H/H. Has received 4u total of PRBCs this admission.  Drop in hemoglobin this morning 6.3, additional 2 unit PRBC ordered to be transfused. Diet advanced to regular this morning. Recurrent GI bleed this afternoon with  reported 2 bloody stools.  Leukocytosis, likely reactive in the setting of severe anemia WBC downtrending 14.3 from 17.1. No evidence of active infective process Repeat CBC in the morning  Mild thrombocytopenia Platelet count slightly uptrending 135 from 134. Monitor for now   Obesity: Estimated body mass index is 31.83 kg/m as calculated from the following:   Height as of 06/17/17: 5\' 2"  (1.575 m).   Weight as of 06/17/17: 78.9 kg.   DVT prophylaxis: SCDs Code Status: Full Family Communication: None at bedside. Husband by phone 9/4 and 9/6. Disposition Plan:  Status is: Inpatient   The patient will require care spanning > 2 midnights and should be moved to inpatient because:  Acute, transfusion-dependent blood loss anemia with need for continued monitoring   Dispo: The patient is from: Home              Anticipated d/c is to: Home Needs to be without evidence of bleeding and stable hgb x24 hours at the least to be discharged.               Patient currently is not medically stable to d/c.   Consultants:  11/6 GI Interventional Radiology   Procedures:  1u PRBC 9/2 1u PRBC 9/4 2u PRBC 9/5 08/28/2021 Dr. 10/28/2021: Ultrasound guided venous access, superior mesenteric arteriography, arteriography of transverse colonic arterial supply, embolization of SMA branch supplying transverse colon   Antimicrobials: None        Objective: Vitals:   08/29/21 1834 08/29/21 2317 08/30/21 0452 08/30/21 1151  BP: 113/64 114/61 126/66 111/67  Pulse: 90 91  87 86  Resp: 16 18 19 18   Temp: 98.5 F (36.9 C) 99.5 F (37.5 C) 98.4 F (36.9 C) 98.6 F (37 C)  TempSrc: Oral Oral Oral Oral  SpO2: 100% 98% 98% 100%    Intake/Output Summary (Last 24 hours) at 08/30/2021 1917 Last data filed at 08/30/2021 1600 Gross per 24 hour  Intake 1200 ml  Output --  Net 1200 ml   There were no vitals filed for this visit.  Exam:  General: 75 y.o. year-old female well-developed and nourished in  no acute stress.  Alert oriented on 3.   Cardiovascular: Regular rate and rhythm no rubs or gallops.   Respiratory: Clear to auscultation no wheezes or rales.   Abdomen: Soft nontender no bowel sounds present.   Musculoskeletal: Trace lower extremity edema bilaterally.   Skin: No ulcerative lesions noted. Psychiatry: Mood is appropriate for condition and setting.   Data Reviewed: CBC: Recent Labs  Lab 08/24/21 1450 08/24/21 2151 08/25/21 0228 08/25/21 1125 08/26/21 0731 08/26/21 1311 08/27/21 2035 08/28/21 0549 08/28/21 1450 08/29/21 0328 08/29/21 2058  WBC 9.1  --  9.9  --  8.1  --   --   --  17.1* 14.3*  --   NEUTROABS 5.0  --   --   --   --   --   --   --   --   --   --   HGB 8.1*   < > 8.4*   < > 7.2*   < > 7.7* 8.5* 7.7* 6.3* 9.2*  HCT 25.8*   < > 26.1*   < > 22.4*   < > 23.6* 25.8* 23.3* 19.3* 27.8*  MCV 83.2  --  81.8  --  82.7  --   --   --  82.6 82.8  --   PLT 208  --  211  --  207  --   --   --  134* 135*  --    < > = values in this interval not displayed.   Basic Metabolic Panel: Recent Labs  Lab 08/24/21 1450 08/25/21 0228 08/25/21 1844 08/26/21 0647 08/28/21 1450 08/29/21 0328  NA 139 139  --  137 139 138  K 4.3 4.2  --  3.5 3.9 3.6  CL 109 107  --  108 109 106  CO2 24 24  --  23 25 24   GLUCOSE 101* 101*  --  114* 145* 125*  BUN 14 12 8  5* 11 13  CREATININE 0.62 0.53  --  0.65 0.80 0.73  CALCIUM 9.2 9.1  --  8.6* 8.3* 8.4*   GFR: CrCl cannot be calculated (Unknown ideal weight.). Liver Function Tests: Recent Labs  Lab 08/24/21 1450 08/26/21 0647  AST 17 18  ALT 10 10  ALKPHOS 33* 30*  BILITOT 0.6 0.1*  PROT 6.2* 5.6*  ALBUMIN 3.2* 2.8*   No results for input(s): LIPASE, AMYLASE in the last 168 hours. No results for input(s): AMMONIA in the last 168 hours. Coagulation Profile: Recent Labs  Lab 08/24/21 1450  INR 1.1   Cardiac Enzymes: No results for input(s): CKTOTAL, CKMB, CKMBINDEX, TROPONINI in the last 168 hours. BNP (last 3  results) No results for input(s): PROBNP in the last 8760 hours. HbA1C: No results for input(s): HGBA1C in the last 72 hours. CBG: Recent Labs  Lab 08/26/21 1808  GLUCAP 138*   Lipid Profile: No results for input(s): CHOL, HDL, LDLCALC, TRIG, CHOLHDL, LDLDIRECT in the last 72 hours. Thyroid Function  Tests: No results for input(s): TSH, T4TOTAL, FREET4, T3FREE, THYROIDAB in the last 72 hours. Anemia Panel: No results for input(s): VITAMINB12, FOLATE, FERRITIN, TIBC, IRON, RETICCTPCT in the last 72 hours. Urine analysis:    Component Value Date/Time   COLORURINE YELLOW 08/24/2021 1932   APPEARANCEUR CLEAR 08/24/2021 1932   LABSPEC 1.020 08/24/2021 1932   PHURINE 5.0 08/24/2021 1932   GLUCOSEU NEGATIVE 08/24/2021 1932   HGBUR MODERATE (A) 08/24/2021 1932   BILIRUBINUR NEGATIVE 08/24/2021 1932   KETONESUR 5 (A) 08/24/2021 1932   PROTEINUR NEGATIVE 08/24/2021 1932   NITRITE NEGATIVE 08/24/2021 1932   LEUKOCYTESUR NEGATIVE 08/24/2021 1932   Sepsis Labs: @LABRCNTIP (procalcitonin:4,lacticidven:4)  ) Recent Results (from the past 240 hour(s))  Resp Panel by RT-PCR (Flu A&B, Covid) Nasopharyngeal Swab     Status: None   Collection Time: 08/24/21  1:10 PM   Specimen: Nasopharyngeal Swab; Nasopharyngeal(NP) swabs in vial transport medium  Result Value Ref Range Status   SARS Coronavirus 2 by RT PCR NEGATIVE NEGATIVE Final    Comment: (NOTE) SARS-CoV-2 target nucleic acids are NOT DETECTED.  The SARS-CoV-2 RNA is generally detectable in upper respiratory specimens during the acute phase of infection. The lowest concentration of SARS-CoV-2 viral copies this assay can detect is 138 copies/mL. A negative result does not preclude SARS-Cov-2 infection and should not be used as the sole basis for treatment or other patient management decisions. A negative result may occur with  improper specimen collection/handling, submission of specimen other than nasopharyngeal swab, presence of  viral mutation(s) within the areas targeted by this assay, and inadequate number of viral copies(<138 copies/mL). A negative result must be combined with clinical observations, patient history, and epidemiological information. The expected result is Negative.  Fact Sheet for Patients:  10/24/21  Fact Sheet for Healthcare Providers:  BloggerCourse.com  This test is no t yet approved or cleared by the [employer] FDA and  has been authorized for detection and/or diagnosis of SARS-CoV-2 by FDA under an Emergency Use Authorization (EUA). This EUA will remain  in effect (meaning this test can be used) for the duration of the COVID-19 declaration under Section 564(b)(1) of the Act, 21 U.S.C.section 360bbb-3(b)(1), unless the authorization is terminated  or revoked sooner.       Influenza A by PCR NEGATIVE NEGATIVE Final   Influenza B by PCR NEGATIVE NEGATIVE Final    Comment: (NOTE) The Xpert Xpress SARS-CoV-2/FLU/RSV plus assay is intended as an aid in the diagnosis of influenza from Nasopharyngeal swab specimens and should not be used as a sole basis for treatment. Nasal washings and aspirates are unacceptable for Xpert Xpress SARS-CoV-2/FLU/RSV testing.  Fact Sheet for Patients: [place]  Fact Sheet for Healthcare Providers: BloggerCourse.com  This test is not yet approved or cleared by the [employer] FDA and has been authorized for detection and/or diagnosis of SARS-CoV-2 by FDA under an Emergency Use Authorization (EUA). This EUA will remain in effect (meaning this test can be used) for the duration of the COVID-19 declaration under Section 564(b)(1) of the Act, 21 U.S.C. section 360bbb-3(b)(1), unless the authorization is terminated or revoked.  Performed at [facility], [address] [place], [place]       Studies: No results  found.  Scheduled Meds:  sodium chloride   Intravenous Once   sodium chloride flush  3 mL Intravenous Q12H    Continuous Infusions:   LOS: 5 days     [id number], MD [facility] Pager [phone]  If 7PM-7AM, please contact night-coverage [email or web address] Password [email or web address], [email or web address] 08/30/2021, 7:17 PM

## 2021-08-30 NOTE — Progress Notes (Signed)
Referring Physician(s): Dr. Aubery Lapping  Supervising Physician: Marliss Coots  Patient Status:  [facility] - In-pt  Chief Complaint:  Arteriogram  with catheterization and single coil embolization of the fourth order descending branch supplying the transverse colon  by Dr. Fredia Sorrow on 9.6.22  Subjective:  Patient laying in bed not in acute distress, with husband present.  Denies SOB, lightheadedness, dizziness, abdominal pain, n/v, seeing blood in stool.    Allergies: Ciprofloxacin  Medications: Prior to Admission medications   Medication Sig Start Date End Date Taking? Authorizing Provider  acetaminophen (TYLENOL) 500 MG tablet Take 500-1,000 mg by mouth every 8 (eight) hours as needed for moderate pain, headache or mild pain.   Yes [provider]  benzonatate (TESSALON) 100 MG capsule Use 1 every 4 hour as needed for cough Patient not taking: Reported on 08/24/2021 11/25/12   Storm Frisk, MD  NONFORMULARY OR COMPOUNDED ITEM Antiinflammatory Cream: Diclofenac 3%, Baclofen 2%, Cyclobenzaprine 2%, Lidocaine 2% dispense 120gram, apply 1-2 grams to affected area 3-4 times daily Patient not taking: Reported on 08/24/2021 09/29/15   Lenn Sink, DPM     Vital Signs: BP 126/66 (BP Location: Left Arm)   Pulse 87   Temp 98.4 F (36.9 C) (Oral)   Resp 19   SpO2 98%   Physical Exam Vitals and nursing note reviewed.  Constitutional:      General: She is not in acute distress.    Appearance: Normal appearance. She is well-developed.  HENT:     Head: Normocephalic and atraumatic.  Cardiovascular:     Comments:    Pulmonary:     Effort: Pulmonary effort is normal.  Abdominal:     General: Abdomen is flat.     Palpations: Abdomen is soft.  Musculoskeletal:     Cervical back: Neck supple.  Skin:    General: Skin is warm and dry.     Coloration: Skin is not jaundiced or pale.     Comments: Positive dressing on right CFA puncture site. Site is unremarkable with no  erythema, edema, tenderness, bleeding or drainage. Minimal amount of old, dry blood noted on the dressing. Dressing otherwise clean, dry, and intact.    Neurological:     Mental Status: She is alert and oriented to person, place, and time.  Psychiatric:        Mood and Affect: Mood normal.        Behavior: Behavior normal.    Imaging: IR Angiogram Visceral Selective  Result Date: 08/29/2021 INDICATION: Lower GI bleed and hypotension with positive CT angiography demonstrating active extravasation of contrast from a branch supplying the distal transverse colon. The patient has a single current functioning IV through which she is receiving blood. There has been inability to establish a second peripheral IV. EXAM: 1. ULTRASOUND-GUIDED PERIPHERAL INTRAVENOUS ACCESS 2. ULTRASOUND GUIDANCE FOR VASCULAR ACCESS OF THE RIGHT COMMON FEMORAL ARTERY 3. SELECTIVE ARTERIOGRAPHY OF THE SUPERIOR MESENTERIC ARTERY 4. ADDITIONAL SELECTIVE ARTERIOGRAPHY OF A THIRD ORDER SMA BRANCH 5. ADDITIONAL SELECTIVE ARTERIOGRAPHY A THIRD ORDER SMA BRANCH 6. ADDITIONAL SELECTIVE ARTERIOGRAPHY A FOURTH ORDER SMA BRANCH 7. TRANSCATHETER EMBOLIZATION OF SMA BRANCH 8. FOLLOW-UP ARTERIOGRAPHY AFTER EMBOLIZATION MEDICATIONS: None ANESTHESIA/SEDATION: Moderate (conscious) sedation was employed during this procedure. A total of Versed 3.0 mg and Fentanyl 100 mcg was administered intravenously. Moderate Sedation Time: 99 minutes. The patient's level of consciousness and vital signs were monitored continuously by radiology nursing throughout the procedure under my direct supervision. CONTRAST:  75 mL Omnipaque 350  FLUOROSCOPY TIME:  Fluoroscopy Time: 33 minutes.  716 mGy COMPLICATIONS: None immediate. PROCEDURE: Informed consent was obtained from the patient's husband following explanation of the procedure, risks, benefits and alternatives. The patient and her husband understand, agree and consent for the procedure. All questions were addressed.  The husband signed consent due to patient history of history of mild dementia. A time out was performed prior to the initiation of the procedure. Maximal barrier sterile technique utilized including caps, mask, sterile gowns, sterile gloves, large sterile drape, hand hygiene, and chlorhexidine prep. Ultrasound was used to confirm patency of the right basilic vein in the upper arm. Local anesthesia was provided with 1% lidocaine. Under ultrasound guidance, a 21 gauge needle was utilized to access the vein. Ultrasound image documentation was performed. A guidewire was advanced. A 4 French micropuncture dilator was advanced into the vein and secured for use as intravenous access. Ultrasound was used to confirm patency of the right common femoral artery. Local anesthesia was provided with 1% lidocaine. Under ultrasound guidance, a 21 gauge needle was advanced into the right common femoral artery. Ultrasound image documentation was performed. After placement of a micropuncture dilator, a 5 French sheath was then placed over a guidewire. A 5 French Cobra catheter was advanced into the abdominal aorta and used to selectively catheterize the superior mesenteric artery. Selective arteriography of the superior mesenteric artery was performed in different projections. A microcatheter was then advanced through the 5 French catheter and into the trunk of the superior mesenteric artery. A third order branch vessel was catheterized and selective arteriography performed. A separate second order SMA branch was then catheterized with a microcatheter followed by further catheterization of a third order branch. Selective arteriography was performed. The microcatheter was then used to further catheterize a fourth order branch. Selective arteriography was performed. Transcatheter embolization was performed with deployment of a single 1 mm x 2 cm Velecia LP coil. Additional follow-up arteriography was performed. After catheter removal and  sheath removal, hemostasis was obtained with use of the Cordis ExoSeal device. FINDINGS: Intravenous access was successfully stab list at the level of the right basilic vein in the upper arm. This access was utilized for conscious sedation during the procedure and was also kept in place for use during hospital admission. Initial selective arteriography of the superior mesenteric artery demonstrated normal patency of branch vessels without evidence of active bleeding, pseudoaneurysm, vascular malformation or other vascular abnormality. On the third selective SMA arteriogram, there was evidence active contrast extravasation at the level of the mid to distal transverse colon in the region of bleeding identified by CT angiography. 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 After mapping out the probable route of supply to the transverse colon, a tortuous proximal branch was eventually able to be catheterized that supplies the transverse colon. Initial selective arteriography at the level of this branch did not re-demonstrate active bleeding. However, with further advancement of the catheter into the origin of a fourth order descending transverse colonic branch, active extravasation of contrast material was again demonstrated. The microcatheter could not be advanced beyond the origin of this branch vessel due to tortuosity and small vessel caliber as well as probable component of spasm. Therefore, coil embolization was performed at the base of this vessel resulting in occlusion as well as some additional branch vessels distal to the coil. Follow-up arteriography did not  demonstrate active extravasation at the level of previously demonstrated bleeding. IMPRESSION: 1. Additional peripheral intravenous access established under ultrasound guidance for use for IV conscious sedation  during the procedure. 2. Arteriography of the superior mesenteric artery as well as multiple distal branch vessel supply demonstrates active contrast extravasation from a fourth order descending branch supplying the transverse colon at the level of active bleeding identified by CT angiography. The origin of this branch vessel was able to be successfully catheterized with deployment of a single embolization coil resulting in branch vessel occlusion and lack of contrast extravasation following embolization. Electronically Signed   By: Irish Lack M.D.   On: 08/29/2021 08:36   IR Angiogram Selective Each Additional Vessel  Result Date: 08/29/2021 INDICATION: Lower GI bleed and hypotension with positive CT angiography demonstrating active extravasation of contrast from a branch supplying the distal transverse colon. The patient has a single current functioning IV through which she is receiving blood. There has been inability to establish a second peripheral IV. EXAM: 1. ULTRASOUND-GUIDED PERIPHERAL INTRAVENOUS ACCESS 2. ULTRASOUND GUIDANCE FOR VASCULAR ACCESS OF THE RIGHT COMMON FEMORAL ARTERY 3. SELECTIVE ARTERIOGRAPHY OF THE SUPERIOR MESENTERIC ARTERY 4. ADDITIONAL SELECTIVE ARTERIOGRAPHY OF A THIRD ORDER SMA BRANCH 5. ADDITIONAL SELECTIVE ARTERIOGRAPHY A THIRD ORDER SMA BRANCH 6. ADDITIONAL SELECTIVE ARTERIOGRAPHY A FOURTH ORDER SMA BRANCH 7. TRANSCATHETER EMBOLIZATION OF SMA BRANCH 8. FOLLOW-UP ARTERIOGRAPHY AFTER EMBOLIZATION MEDICATIONS: None ANESTHESIA/SEDATION: Moderate (conscious) sedation was employed during this procedure. A total of Versed 3.0 mg and Fentanyl 100 mcg was administered intravenously. Moderate Sedation Time: 99 minutes. The patient's level of consciousness and vital signs were monitored continuously by radiology nursing throughout the procedure under my direct supervision. CONTRAST:  75 mL Omnipaque 350 FLUOROSCOPY TIME:  Fluoroscopy Time: 33 minutes.  716 mGy COMPLICATIONS: None  immediate. PROCEDURE: Informed consent was obtained from the patient's husband following explanation of the procedure, risks, benefits and alternatives. The patient and her husband understand, agree and consent for the procedure. All questions were addressed. The husband signed consent due to patient history of history of mild dementia. A time out was performed prior to the initiation of the procedure. Maximal barrier sterile technique utilized including caps, mask, sterile gowns, sterile gloves, large sterile drape, hand hygiene, and chlorhexidine prep. Ultrasound was used to confirm patency of the right basilic vein in the upper arm. Local anesthesia was provided with 1% lidocaine. Under ultrasound guidance, a 21 gauge needle was utilized to access the vein. Ultrasound image documentation was performed. A guidewire was advanced. A 4 French micropuncture dilator was advanced into the vein and secured for use as intravenous access. Ultrasound was used to confirm patency of the right common femoral artery. Local anesthesia was provided with 1% lidocaine. Under ultrasound guidance, a 21 gauge needle was advanced into the right common femoral artery. Ultrasound image documentation was performed. After placement of a micropuncture dilator, a 5 French sheath was then placed over a guidewire. A 5 French Cobra catheter was advanced into the abdominal aorta and used to selectively catheterize the superior mesenteric artery. Selective arteriography of the superior mesenteric artery was performed in different projections. A microcatheter was then advanced through the 5 French catheter and into the trunk of the superior mesenteric artery. A third order branch vessel was catheterized and selective arteriography performed. A separate second order SMA branch was then catheterized with a microcatheter followed by further catheterization of a third order branch. Selective arteriography was performed. The microcatheter was then used  to further catheterize a  fourth order branch. Selective arteriography was performed. Transcatheter embolization was performed with deployment of a single 1 mm x 2 cm Lyndell LP coil. Additional follow-up arteriography was performed. After catheter removal and sheath removal, hemostasis was obtained with use of the Cordis ExoSeal device. FINDINGS: Intravenous access was successfully stab list at the level of the right basilic vein in the upper arm. This access was utilized for conscious sedation during the procedure and was also kept in place for use during hospital admission. Initial selective arteriography of the superior mesenteric artery demonstrated normal patency of branch vessels without evidence of active bleeding, pseudoaneurysm, vascular malformation or other vascular abnormality. On the third selective SMA arteriogram, there was evidence active contrast extravasation at the level of the mid to distal transverse colon in the region of bleeding identified by CT angiography. 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 After mapping out the probable route of supply to the transverse colon, a tortuous proximal branch was eventually able to be catheterized that supplies the transverse colon. Initial selective arteriography at the level of this branch did not re-demonstrate active bleeding. However, with further advancement of the catheter into the origin of a fourth order descending transverse colonic branch, active extravasation of contrast material was again demonstrated. The microcatheter could not be advanced beyond the origin of this branch vessel due to tortuosity and small vessel caliber as well as probable component of spasm. Therefore, coil embolization was performed at the base of this vessel resulting in occlusion as well as some additional branch vessels  distal to the coil. Follow-up arteriography did not demonstrate active extravasation at the level of previously demonstrated bleeding. IMPRESSION: 1. Additional peripheral intravenous access established under ultrasound guidance for use for IV conscious sedation during the procedure. 2. Arteriography of the superior mesenteric artery as well as multiple distal branch vessel supply demonstrates active contrast extravasation from a fourth order descending branch supplying the transverse colon at the level of active bleeding identified by CT angiography. The origin of this branch vessel was able to be successfully catheterized with deployment of a single embolization coil resulting in branch vessel occlusion and lack of contrast extravasation following embolization. Electronically Signed   By: Irish Lack M.D.   On: 08/29/2021 08:36   IR Angiogram Selective Each Additional Vessel  Result Date: 08/29/2021 INDICATION: Lower GI bleed and hypotension with positive CT angiography demonstrating active extravasation of contrast from a branch supplying the distal transverse colon. The patient has a single current functioning IV through which she is receiving blood. There has been inability to establish a second peripheral IV. EXAM: 1. ULTRASOUND-GUIDED PERIPHERAL INTRAVENOUS ACCESS 2. ULTRASOUND GUIDANCE FOR VASCULAR ACCESS OF THE RIGHT COMMON FEMORAL ARTERY 3. SELECTIVE ARTERIOGRAPHY OF THE SUPERIOR MESENTERIC ARTERY 4. ADDITIONAL SELECTIVE ARTERIOGRAPHY OF A THIRD ORDER SMA BRANCH 5. ADDITIONAL SELECTIVE ARTERIOGRAPHY A THIRD ORDER SMA BRANCH 6. ADDITIONAL SELECTIVE ARTERIOGRAPHY A FOURTH ORDER SMA BRANCH 7. TRANSCATHETER EMBOLIZATION OF SMA BRANCH 8. FOLLOW-UP ARTERIOGRAPHY AFTER EMBOLIZATION MEDICATIONS: None ANESTHESIA/SEDATION: Moderate (conscious) sedation was employed during this procedure. A total of Versed 3.0 mg and Fentanyl 100 mcg was administered intravenously. Moderate Sedation Time: 99 minutes. The  patient's level of consciousness and vital signs were monitored continuously by radiology nursing throughout the procedure under my direct supervision. CONTRAST:  75 mL Omnipaque 350 FLUOROSCOPY TIME:  Fluoroscopy Time: 33 minutes.  716 mGy COMPLICATIONS:  None immediate. PROCEDURE: Informed consent was obtained from the patient's husband following explanation of the procedure, risks, benefits and alternatives. The patient and her husband understand, agree and consent for the procedure. All questions were addressed. The husband signed consent due to patient history of history of mild dementia. A time out was performed prior to the initiation of the procedure. Maximal barrier sterile technique utilized including caps, mask, sterile gowns, sterile gloves, large sterile drape, hand hygiene, and chlorhexidine prep. Ultrasound was used to confirm patency of the right basilic vein in the upper arm. Local anesthesia was provided with 1% lidocaine. Under ultrasound guidance, a 21 gauge needle was utilized to access the vein. Ultrasound image documentation was performed. A guidewire was advanced. A 4 French micropuncture dilator was advanced into the vein and secured for use as intravenous access. Ultrasound was used to confirm patency of the right common femoral artery. Local anesthesia was provided with 1% lidocaine. Under ultrasound guidance, a 21 gauge needle was advanced into the right common femoral artery. Ultrasound image documentation was performed. After placement of a micropuncture dilator, a 5 French sheath was then placed over a guidewire. A 5 French Cobra catheter was advanced into the abdominal aorta and used to selectively catheterize the superior mesenteric artery. Selective arteriography of the superior mesenteric artery was performed in different projections. A microcatheter was then advanced through the 5 French catheter and into the trunk of the superior mesenteric artery. A third order branch vessel was  catheterized and selective arteriography performed. A separate second order SMA branch was then catheterized with a microcatheter followed by further catheterization of a third order branch. Selective arteriography was performed. The microcatheter was then used to further catheterize a fourth order branch. Selective arteriography was performed. Transcatheter embolization was performed with deployment of a single 1 mm x 2 cm Blaine LP coil. Additional follow-up arteriography was performed. After catheter removal and sheath removal, hemostasis was obtained with use of the Cordis ExoSeal device. FINDINGS: Intravenous access was successfully stab list at the level of the right basilic vein in the upper arm. This access was utilized for conscious sedation during the procedure and was also kept in place for use during hospital admission. Initial selective arteriography of the superior mesenteric artery demonstrated normal patency of branch vessels without evidence of active bleeding, pseudoaneurysm, vascular malformation or other vascular abnormality. On the third selective SMA arteriogram, there was evidence active contrast extravasation at the level of the mid to distal transverse colon in the region of bleeding identified by CT angiography. 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 After mapping out the probable route of supply to the transverse colon, a tortuous proximal branch was eventually able to be catheterized that supplies the transverse colon. Initial selective arteriography at the level of this branch did not re-demonstrate active bleeding. However, with further advancement of the catheter into the origin of a fourth order descending transverse colonic branch, active extravasation of contrast material was again demonstrated. The microcatheter could not be advanced  beyond the origin of this branch vessel due to tortuosity and small vessel caliber as well as probable component of spasm. Therefore, coil embolization was performed at the base of this vessel resulting in occlusion as well as some additional branch vessels distal to the coil. Follow-up arteriography did not demonstrate active extravasation at the level of previously demonstrated bleeding. IMPRESSION:  1. Additional peripheral intravenous access established under ultrasound guidance for use for IV conscious sedation during the procedure. 2. Arteriography of the superior mesenteric artery as well as multiple distal branch vessel supply demonstrates active contrast extravasation from a fourth order descending branch supplying the transverse colon at the level of active bleeding identified by CT angiography. The origin of this branch vessel was able to be successfully catheterized with deployment of a single embolization coil resulting in branch vessel occlusion and lack of contrast extravasation following embolization. Electronically Signed   By: Irish Lack M.D.   On: 08/29/2021 08:36   IR Angiogram Selective Each Additional Vessel  Result Date: 08/29/2021 INDICATION: Lower GI bleed and hypotension with positive CT angiography demonstrating active extravasation of contrast from a branch supplying the distal transverse colon. The patient has a single current functioning IV through which she is receiving blood. There has been inability to establish a second peripheral IV. EXAM: 1. ULTRASOUND-GUIDED PERIPHERAL INTRAVENOUS ACCESS 2. ULTRASOUND GUIDANCE FOR VASCULAR ACCESS OF THE RIGHT COMMON FEMORAL ARTERY 3. SELECTIVE ARTERIOGRAPHY OF THE SUPERIOR MESENTERIC ARTERY 4. ADDITIONAL SELECTIVE ARTERIOGRAPHY OF A THIRD ORDER SMA BRANCH 5. ADDITIONAL SELECTIVE ARTERIOGRAPHY A THIRD ORDER SMA BRANCH 6. ADDITIONAL SELECTIVE ARTERIOGRAPHY A FOURTH ORDER SMA BRANCH 7. TRANSCATHETER EMBOLIZATION OF SMA BRANCH 8. FOLLOW-UP  ARTERIOGRAPHY AFTER EMBOLIZATION MEDICATIONS: None ANESTHESIA/SEDATION: Moderate (conscious) sedation was employed during this procedure. A total of Versed 3.0 mg and Fentanyl 100 mcg was administered intravenously. Moderate Sedation Time: 99 minutes. The patient's level of consciousness and vital signs were monitored continuously by radiology nursing throughout the procedure under my direct supervision. CONTRAST:  75 mL Omnipaque 350 FLUOROSCOPY TIME:  Fluoroscopy Time: 33 minutes.  716 mGy COMPLICATIONS: None immediate. PROCEDURE: Informed consent was obtained from the patient's husband following explanation of the procedure, risks, benefits and alternatives. The patient and her husband understand, agree and consent for the procedure. All questions were addressed. The husband signed consent due to patient history of history of mild dementia. A time out was performed prior to the initiation of the procedure. Maximal barrier sterile technique utilized including caps, mask, sterile gowns, sterile gloves, large sterile drape, hand hygiene, and chlorhexidine prep. Ultrasound was used to confirm patency of the right basilic vein in the upper arm. Local anesthesia was provided with 1% lidocaine. Under ultrasound guidance, a 21 gauge needle was utilized to access the vein. Ultrasound image documentation was performed. A guidewire was advanced. A 4 French micropuncture dilator was advanced into the vein and secured for use as intravenous access. Ultrasound was used to confirm patency of the right common femoral artery. Local anesthesia was provided with 1% lidocaine. Under ultrasound guidance, a 21 gauge needle was advanced into the right common femoral artery. Ultrasound image documentation was performed. After placement of a micropuncture dilator, a 5 French sheath was then placed over a guidewire. A 5 French Cobra catheter was advanced into the abdominal aorta and used to selectively catheterize the superior mesenteric  artery. Selective arteriography of the superior mesenteric artery was performed in different projections. A microcatheter was then advanced through the 5 French catheter and into the trunk of the superior mesenteric artery. A third order branch vessel was catheterized and selective arteriography performed. A separate second order SMA branch was then catheterized with a microcatheter followed by further catheterization of a third order branch. Selective arteriography was performed. The microcatheter was then used to further catheterize a fourth order branch. Selective arteriography was performed. Transcatheter embolization was performed with  deployment of a single 1 mm x 2 cm Lyzette LP coil. Additional follow-up arteriography was performed. After catheter removal and sheath removal, hemostasis was obtained with use of the Cordis ExoSeal device. FINDINGS: Intravenous access was successfully stab list at the level of the right basilic vein in the upper arm. This access was utilized for conscious sedation during the procedure and was also kept in place for use during hospital admission. Initial selective arteriography of the superior mesenteric artery demonstrated normal patency of branch vessels without evidence of active bleeding, pseudoaneurysm, vascular malformation or other vascular abnormality. On the third selective SMA arteriogram, there was evidence active contrast extravasation at the level of the mid to distal transverse colon in the region of bleeding identified by CT angiography. 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 After mapping out the probable route of supply to the transverse colon, a tortuous proximal branch was eventually able to be catheterized that supplies the transverse colon. Initial selective arteriography at the level of this branch did not  re-demonstrate active bleeding. However, with further advancement of the catheter into the origin of a fourth order descending transverse colonic branch, active extravasation of contrast material was again demonstrated. The microcatheter could not be advanced beyond the origin of this branch vessel due to tortuosity and small vessel caliber as well as probable component of spasm. Therefore, coil embolization was performed at the base of this vessel resulting in occlusion as well as some additional branch vessels distal to the coil. Follow-up arteriography did not demonstrate active extravasation at the level of previously demonstrated bleeding. IMPRESSION: 1. Additional peripheral intravenous access established under ultrasound guidance for use for IV conscious sedation during the procedure. 2. Arteriography of the superior mesenteric artery as well as multiple distal branch vessel supply demonstrates active contrast extravasation from a fourth order descending branch supplying the transverse colon at the level of active bleeding identified by CT angiography. The origin of this branch vessel was able to be successfully catheterized with deployment of a single embolization coil resulting in branch vessel occlusion and lack of contrast extravasation following embolization. Electronically Signed   By: Irish LackGlenn  Yamagata M.D.   On: 08/29/2021 08:36   IR Angiogram Follow Up Study  Result Date: 08/29/2021 INDICATION: Lower GI bleed and hypotension with positive CT angiography demonstrating active extravasation of contrast from a branch supplying the distal transverse colon. The patient has a single current functioning IV through which she is receiving blood. There has been inability to establish a second peripheral IV. EXAM: 1. ULTRASOUND-GUIDED PERIPHERAL INTRAVENOUS ACCESS 2. ULTRASOUND GUIDANCE FOR VASCULAR ACCESS OF THE RIGHT COMMON FEMORAL ARTERY 3. SELECTIVE ARTERIOGRAPHY OF THE SUPERIOR MESENTERIC ARTERY 4.  ADDITIONAL SELECTIVE ARTERIOGRAPHY OF A THIRD ORDER SMA BRANCH 5. ADDITIONAL SELECTIVE ARTERIOGRAPHY A THIRD ORDER SMA BRANCH 6. ADDITIONAL SELECTIVE ARTERIOGRAPHY A FOURTH ORDER SMA BRANCH 7. TRANSCATHETER EMBOLIZATION OF SMA BRANCH 8. FOLLOW-UP ARTERIOGRAPHY AFTER EMBOLIZATION MEDICATIONS: None ANESTHESIA/SEDATION: Moderate (conscious) sedation was employed during this procedure. A total of Versed 3.0 mg and Fentanyl 100 mcg was administered intravenously. Moderate Sedation Time: 99 minutes. The patient's level of consciousness and vital signs were monitored continuously by radiology nursing throughout the procedure under my direct supervision. CONTRAST:  75 mL Omnipaque 350 FLUOROSCOPY TIME:  Fluoroscopy Time: 33 minutes.  716 mGy COMPLICATIONS: None immediate. PROCEDURE: Informed consent was obtained from the patient's husband following explanation  of the procedure, risks, benefits and alternatives. The patient and her husband understand, agree and consent for the procedure. All questions were addressed. The husband signed consent due to patient history of history of mild dementia. A time out was performed prior to the initiation of the procedure. Maximal barrier sterile technique utilized including caps, mask, sterile gowns, sterile gloves, large sterile drape, hand hygiene, and chlorhexidine prep. Ultrasound was used to confirm patency of the right basilic vein in the upper arm. Local anesthesia was provided with 1% lidocaine. Under ultrasound guidance, a 21 gauge needle was utilized to access the vein. Ultrasound image documentation was performed. A guidewire was advanced. A 4 French micropuncture dilator was advanced into the vein and secured for use as intravenous access. Ultrasound was used to confirm patency of the right common femoral artery. Local anesthesia was provided with 1% lidocaine. Under ultrasound guidance, a 21 gauge needle was advanced into the right common femoral artery. Ultrasound image  documentation was performed. After placement of a micropuncture dilator, a 5 French sheath was then placed over a guidewire. A 5 French Cobra catheter was advanced into the abdominal aorta and used to selectively catheterize the superior mesenteric artery. Selective arteriography of the superior mesenteric artery was performed in different projections. A microcatheter was then advanced through the 5 French catheter and into the trunk of the superior mesenteric artery. A third order branch vessel was catheterized and selective arteriography performed. A separate second order SMA branch was then catheterized with a microcatheter followed by further catheterization of a third order branch. Selective arteriography was performed. The microcatheter was then used to further catheterize a fourth order branch. Selective arteriography was performed. Transcatheter embolization was performed with deployment of a single 1 mm x 2 cm [place] LP coil. Additional follow-up arteriography was performed. After catheter removal and sheath removal, hemostasis was obtained with use of the Cordis ExoSeal device. FINDINGS: Intravenous access was successfully stab list at the level of the right basilic vein in the upper arm. This access was utilized for conscious sedation during the procedure and was also kept in place for use during hospital admission. Initial selective arteriography of the superior mesenteric artery demonstrated normal patency of branch vessels without evidence of active bleeding, pseudoaneurysm, vascular malformation or other vascular abnormality. On the third selective SMA arteriogram, there was evidence active contrast extravasation at the level of the mid to distal transverse colon in the region of bleeding identified by CT angiography. 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 After mapping out the probable route of supply to the transverse colon, a tortuous proximal branch was eventually able to be catheterized that supplies the transverse colon. Initial selective arteriography at the level of this branch did not re-demonstrate active bleeding. However, with further advancement of the catheter into the origin of a fourth order descending transverse colonic branch, active extravasation of contrast material was again demonstrated. The microcatheter could not be advanced beyond the origin of this branch vessel due to tortuosity and small vessel caliber as well as probable component of spasm. Therefore, coil embolization was performed at the base of this vessel resulting in occlusion as well as some additional branch vessels distal to the coil. Follow-up arteriography did not demonstrate active extravasation at the level of previously demonstrated bleeding. IMPRESSION: 1. Additional peripheral intravenous access established under ultrasound guidance for use for IV  conscious sedation during the procedure. 2. Arteriography of the superior mesenteric artery as well as multiple distal branch vessel supply demonstrates active contrast extravasation from a fourth order descending branch supplying the transverse colon at the level of active bleeding identified by CT angiography. The origin of this branch vessel was able to be successfully catheterized with deployment of a single embolization coil resulting in branch vessel occlusion and lack of contrast extravasation following embolization. Electronically Signed   By: Irish Lack M.D.   On: 08/29/2021 08:36   IR US Guide Vasc Access Right  Result Date: 08/29/2021 INDICATION: Lower GI bleed and hypotension with positive CT angiography demonstrating active extravasation of contrast from a branch supplying the distal transverse colon. The patient has a single current functioning IV through which she is receiving blood. There has  been inability to establish a second peripheral IV. EXAM: 1. ULTRASOUND-GUIDED PERIPHERAL INTRAVENOUS ACCESS 2. ULTRASOUND GUIDANCE FOR VASCULAR ACCESS OF THE RIGHT COMMON FEMORAL ARTERY 3. SELECTIVE ARTERIOGRAPHY OF THE SUPERIOR MESENTERIC ARTERY 4. ADDITIONAL SELECTIVE ARTERIOGRAPHY OF A THIRD ORDER SMA BRANCH 5. ADDITIONAL SELECTIVE ARTERIOGRAPHY A THIRD ORDER SMA BRANCH 6. ADDITIONAL SELECTIVE ARTERIOGRAPHY A FOURTH ORDER SMA BRANCH 7. TRANSCATHETER EMBOLIZATION OF SMA BRANCH 8. FOLLOW-UP ARTERIOGRAPHY AFTER EMBOLIZATION MEDICATIONS: None ANESTHESIA/SEDATION: Moderate (conscious) sedation was employed during this procedure. A total of Versed 3.0 mg and Fentanyl 100 mcg was administered intravenously. Moderate Sedation Time: 99 minutes. The patient's level of consciousness and vital signs were monitored continuously by radiology nursing throughout the procedure under my direct supervision. CONTRAST:  75 mL Omnipaque 350 FLUOROSCOPY TIME:  Fluoroscopy Time: 33 minutes.  716 mGy COMPLICATIONS: None immediate. PROCEDURE: Informed consent was obtained from the patient's husband following explanation of the procedure, risks, benefits and alternatives. The patient and her husband understand, agree and consent for the procedure. All questions were addressed. The husband signed consent due to patient history of history of mild dementia. A time out was performed prior to the initiation of the procedure. Maximal barrier sterile technique utilized including caps, mask, sterile gowns, sterile gloves, large sterile drape, hand hygiene, and chlorhexidine prep. Ultrasound was used to confirm patency of the right basilic vein in the upper arm. Local anesthesia was provided with 1% lidocaine. Under ultrasound guidance, a 21 gauge needle was utilized to access the vein. Ultrasound image documentation was performed. A guidewire was advanced. A 4 French micropuncture dilator was advanced into the vein and secured for use as  intravenous access. Ultrasound was used to confirm patency of the right common femoral artery. Local anesthesia was provided with 1% lidocaine. Under ultrasound guidance, a 21 gauge needle was advanced into the right common femoral artery. Ultrasound image documentation was performed. After placement of a micropuncture dilator, a 5 French sheath was then placed over a guidewire. A 5 French Cobra catheter was advanced into the abdominal aorta and used to selectively catheterize the superior mesenteric artery. Selective arteriography of the superior mesenteric artery was performed in different projections. A microcatheter was then advanced through the 5 French catheter and into the trunk of the superior mesenteric artery. A third order branch vessel was catheterized and selective arteriography performed. A separate second order SMA branch was then catheterized with a microcatheter followed by further catheterization of a third order branch. Selective arteriography was performed. The microcatheter was then used to further catheterize a fourth order branch. Selective arteriography was performed. Transcatheter embolization was performed with deployment of a single 1 mm x 2 cm [place] LP coil. Additional  follow-up arteriography was performed. After catheter removal and sheath removal, hemostasis was obtained with use of the Cordis ExoSeal device. FINDINGS: Intravenous access was successfully stab list at the level of the right basilic vein in the upper arm. This access was utilized for conscious sedation during the procedure and was also kept in place for use during hospital admission. Initial selective arteriography of the superior mesenteric artery demonstrated normal patency of branch vessels without evidence of active bleeding, pseudoaneurysm, vascular malformation or other vascular abnormality. On the third selective SMA arteriogram, there was evidence active contrast extravasation at the level of the mid to distal  transverse colon in the region of bleeding identified by CT angiography. 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 After mapping out the probable route of supply to the transverse colon, a tortuous proximal branch was eventually able to be catheterized that supplies the transverse colon. Initial selective arteriography at the level of this branch did not re-demonstrate active bleeding. However, with further advancement of the catheter into the origin of a fourth order descending transverse colonic branch, active extravasation of contrast material was again demonstrated. The microcatheter could not be advanced beyond the origin of this branch vessel due to tortuosity and small vessel caliber as well as probable component of spasm. Therefore, coil embolization was performed at the base of this vessel resulting in occlusion as well as some additional branch vessels distal to the coil. Follow-up arteriography did not demonstrate active extravasation at the level of previously demonstrated bleeding. IMPRESSION: 1. Additional peripheral intravenous access established under ultrasound guidance for use for IV conscious sedation during the procedure. 2. Arteriography of the superior mesenteric artery as well as multiple distal branch vessel supply demonstrates active contrast extravasation from a fourth order descending branch supplying the transverse colon at the level of active bleeding identified by CT angiography. The origin of this branch vessel was able to be successfully catheterized with deployment of a single embolization coil resulting in branch vessel occlusion and lack of contrast extravasation following embolization. Electronically Signed   By: Irish Lack M.D.   On: 08/29/2021 08:36   IR US Guide Vasc Access Right  Result Date: 08/29/2021 INDICATION: Lower GI  bleed and hypotension with positive CT angiography demonstrating active extravasation of contrast from a branch supplying the distal transverse colon. The patient has a single current functioning IV through which she is receiving blood. There has been inability to establish a second peripheral IV. EXAM: 1. ULTRASOUND-GUIDED PERIPHERAL INTRAVENOUS ACCESS 2. ULTRASOUND GUIDANCE FOR VASCULAR ACCESS OF THE RIGHT COMMON FEMORAL ARTERY 3. SELECTIVE ARTERIOGRAPHY OF THE SUPERIOR MESENTERIC ARTERY 4. ADDITIONAL SELECTIVE ARTERIOGRAPHY OF A THIRD ORDER SMA BRANCH 5. ADDITIONAL SELECTIVE ARTERIOGRAPHY A THIRD ORDER SMA BRANCH 6. ADDITIONAL SELECTIVE ARTERIOGRAPHY A FOURTH ORDER SMA BRANCH 7. TRANSCATHETER EMBOLIZATION OF SMA BRANCH 8. FOLLOW-UP ARTERIOGRAPHY AFTER EMBOLIZATION MEDICATIONS: None ANESTHESIA/SEDATION: Moderate (conscious) sedation was employed during this procedure. A total of Versed 3.0 mg and Fentanyl 100 mcg was administered intravenously. Moderate Sedation Time: 99 minutes. The patient's level of consciousness and vital signs were monitored continuously by radiology nursing throughout the procedure under my direct supervision. CONTRAST:  75 mL Omnipaque 350 FLUOROSCOPY TIME:  Fluoroscopy Time: 33 minutes.  716 mGy COMPLICATIONS: None immediate. PROCEDURE: Informed consent was obtained from the patient's husband following explanation of the procedure, risks, benefits and alternatives. The patient and her husband  understand, agree and consent for the procedure. All questions were addressed. The husband signed consent due to patient history of history of mild dementia. A time out was performed prior to the initiation of the procedure. Maximal barrier sterile technique utilized including caps, mask, sterile gowns, sterile gloves, large sterile drape, hand hygiene, and chlorhexidine prep. Ultrasound was used to confirm patency of the right basilic vein in the upper arm. Local anesthesia was provided with 1%  lidocaine. Under ultrasound guidance, a 21 gauge needle was utilized to access the vein. Ultrasound image documentation was performed. A guidewire was advanced. A 4 French micropuncture dilator was advanced into the vein and secured for use as intravenous access. Ultrasound was used to confirm patency of the right common femoral artery. Local anesthesia was provided with 1% lidocaine. Under ultrasound guidance, a 21 gauge needle was advanced into the right common femoral artery. Ultrasound image documentation was performed. After placement of a micropuncture dilator, a 5 French sheath was then placed over a guidewire. A 5 French Cobra catheter was advanced into the abdominal aorta and used to selectively catheterize the superior mesenteric artery. Selective arteriography of the superior mesenteric artery was performed in different projections. A microcatheter was then advanced through the 5 French catheter and into the trunk of the superior mesenteric artery. A third order branch vessel was catheterized and selective arteriography performed. A separate second order SMA branch was then catheterized with a microcatheter followed by further catheterization of a third order branch. Selective arteriography was performed. The microcatheter was then used to further catheterize a fourth order branch. Selective arteriography was performed. Transcatheter embolization was performed with deployment of a single 1 mm x 2 cm [place] LP coil. Additional follow-up arteriography was performed. After catheter removal and sheath removal, hemostasis was obtained with use of the Cordis ExoSeal device. FINDINGS: Intravenous access was successfully stab list at the level of the right basilic vein in the upper arm. This access was utilized for conscious sedation during the procedure and was also kept in place for use during hospital admission. Initial selective arteriography of the superior mesenteric artery demonstrated normal patency of  branch vessels without evidence of active bleeding, pseudoaneurysm, vascular malformation or other vascular abnormality. On the third selective SMA arteriogram, there was evidence active contrast extravasation at the level of the mid to distal transverse colon in the region of bleeding identified by CT angiography. 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 After mapping out the probable route of supply to the transverse colon, a tortuous proximal branch was eventually able to be catheterized that supplies the transverse colon. Initial selective arteriography at the level of this branch did not re-demonstrate active bleeding. However, with further advancement of the catheter into the origin of a fourth order descending transverse colonic branch, active extravasation of contrast material was again demonstrated. The microcatheter could not be advanced beyond the origin of this branch vessel due to tortuosity and small vessel caliber as well as probable component of spasm. Therefore, coil embolization was performed at the base of this vessel resulting in occlusion as well as some additional branch vessels distal to the coil. Follow-up arteriography did not demonstrate active extravasation at the level of previously demonstrated bleeding. IMPRESSION: 1. Additional peripheral intravenous access established under ultrasound guidance for use for IV conscious sedation during the procedure. 2. Arteriography of the superior mesenteric artery  as well as multiple distal branch vessel supply demonstrates active contrast extravasation from a fourth order descending branch supplying the transverse colon at the level of active bleeding identified by CT angiography. The origin of this branch vessel was able to be successfully catheterized with deployment of a single embolization coil  resulting in branch vessel occlusion and lack of contrast extravasation following embolization. Electronically Signed   By: Irish Lack M.D.   On: 08/29/2021 08:36   IR RADIOLOGY PERIPHERAL GUIDED IV START  Result Date: 08/29/2021 INDICATION: Lower GI bleed and hypotension with positive CT angiography demonstrating active extravasation of contrast from a branch supplying the distal transverse colon. The patient has a single current functioning IV through which she is receiving blood. There has been inability to establish a second peripheral IV. EXAM: 1. ULTRASOUND-GUIDED PERIPHERAL INTRAVENOUS ACCESS 2. ULTRASOUND GUIDANCE FOR VASCULAR ACCESS OF THE RIGHT COMMON FEMORAL ARTERY 3. SELECTIVE ARTERIOGRAPHY OF THE SUPERIOR MESENTERIC ARTERY 4. ADDITIONAL SELECTIVE ARTERIOGRAPHY OF A THIRD ORDER SMA BRANCH 5. ADDITIONAL SELECTIVE ARTERIOGRAPHY A THIRD ORDER SMA BRANCH 6. ADDITIONAL SELECTIVE ARTERIOGRAPHY A FOURTH ORDER SMA BRANCH 7. TRANSCATHETER EMBOLIZATION OF SMA BRANCH 8. FOLLOW-UP ARTERIOGRAPHY AFTER EMBOLIZATION MEDICATIONS: None ANESTHESIA/SEDATION: Moderate (conscious) sedation was employed during this procedure. A total of Versed 3.0 mg and Fentanyl 100 mcg was administered intravenously. Moderate Sedation Time: 99 minutes. The patient's level of consciousness and vital signs were monitored continuously by radiology nursing throughout the procedure under my direct supervision. CONTRAST:  75 mL Omnipaque 350 FLUOROSCOPY TIME:  Fluoroscopy Time: 33 minutes.  716 mGy COMPLICATIONS: None immediate. PROCEDURE: Informed consent was obtained from the patient's husband following explanation of the procedure, risks, benefits and alternatives. The patient and her husband understand, agree and consent for the procedure. All questions were addressed. The husband signed consent due to patient history of history of mild dementia. A time out was performed prior to the initiation of the procedure. Maximal barrier  sterile technique utilized including caps, mask, sterile gowns, sterile gloves, large sterile drape, hand hygiene, and chlorhexidine prep. Ultrasound was used to confirm patency of the right basilic vein in the upper arm. Local anesthesia was provided with 1% lidocaine. Under ultrasound guidance, a 21 gauge needle was utilized to access the vein. Ultrasound image documentation was performed. A guidewire was advanced. A 4 French micropuncture dilator was advanced into the vein and secured for use as intravenous access. Ultrasound was used to confirm patency of the right common femoral artery. Local anesthesia was provided with 1% lidocaine. Under ultrasound guidance, a 21 gauge needle was advanced into the right common femoral artery. Ultrasound image documentation was performed. After placement of a micropuncture dilator, a 5 French sheath was then placed over a guidewire. A 5 French Cobra catheter was advanced into the abdominal aorta and used to selectively catheterize the superior mesenteric artery. Selective arteriography of the superior mesenteric artery was performed in different projections. A microcatheter was then advanced through the 5 French catheter and into the trunk of the superior mesenteric artery. A third order branch vessel was catheterized and selective arteriography performed. A separate second order SMA branch was then catheterized with a microcatheter followed by further catheterization of a third order branch. Selective arteriography was performed. The microcatheter was then used to further catheterize a fourth order branch. Selective arteriography was performed. Transcatheter embolization was performed with deployment of a single 1 mm x 2 cm [place] LP coil. Additional follow-up arteriography was performed. After catheter removal and sheath removal, hemostasis was  obtained with use of the Cordis ExoSeal device. FINDINGS: Intravenous access was successfully stab list at the level of the right  basilic vein in the upper arm. This access was utilized for conscious sedation during the procedure and was also kept in place for use during hospital admission. Initial selective arteriography of the superior mesenteric artery demonstrated normal patency of branch vessels without evidence of active bleeding, pseudoaneurysm, vascular malformation or other vascular abnormality. On the third selective SMA arteriogram, there was evidence active contrast extravasation at the level of the mid to distal transverse colon in the region of bleeding identified by CT angiography. 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 After mapping out the probable route of supply to the transverse colon, a tortuous proximal branch was eventually able to be catheterized that supplies the transverse colon. Initial selective arteriography at the level of this branch did not re-demonstrate active bleeding. However, with further advancement of the catheter into the origin of a fourth order descending transverse colonic branch, active extravasation of contrast material was again demonstrated. The microcatheter could not be advanced beyond the origin of this branch vessel due to tortuosity and small vessel caliber as well as probable component of spasm. Therefore, coil embolization was performed at the base of this vessel resulting in occlusion as well as some additional branch vessels distal to the coil. Follow-up arteriography did not demonstrate active extravasation at the level of previously demonstrated bleeding. IMPRESSION: 1. Additional peripheral intravenous access established under ultrasound guidance for use for IV conscious sedation during the procedure. 2. Arteriography of the superior mesenteric artery as well as multiple distal branch vessel supply demonstrates active contrast  extravasation from a fourth order descending branch supplying the transverse colon at the level of active bleeding identified by CT angiography. The origin of this branch vessel was able to be successfully catheterized with deployment of a single embolization coil resulting in branch vessel occlusion and lack of contrast extravasation following embolization. Electronically Signed   By: Irish Lack M.D.   On: 08/29/2021 08:36   IR EMBO ART  VEN HEMORR LYMPH EXTRAV  INC GUIDE ROADMAPPING  Result Date: 08/29/2021 INDICATION: Lower GI bleed and hypotension with positive CT angiography demonstrating active extravasation of contrast from a branch supplying the distal transverse colon. The patient has a single current functioning IV through which she is receiving blood. There has been inability to establish a second peripheral IV. EXAM: 1. ULTRASOUND-GUIDED PERIPHERAL INTRAVENOUS ACCESS 2. ULTRASOUND GUIDANCE FOR VASCULAR ACCESS OF THE RIGHT COMMON FEMORAL ARTERY 3. SELECTIVE ARTERIOGRAPHY OF THE SUPERIOR MESENTERIC ARTERY 4. ADDITIONAL SELECTIVE ARTERIOGRAPHY OF A THIRD ORDER SMA BRANCH 5. ADDITIONAL SELECTIVE ARTERIOGRAPHY A THIRD ORDER SMA BRANCH 6. ADDITIONAL SELECTIVE ARTERIOGRAPHY A FOURTH ORDER SMA BRANCH 7. TRANSCATHETER EMBOLIZATION OF SMA BRANCH 8. FOLLOW-UP ARTERIOGRAPHY AFTER EMBOLIZATION MEDICATIONS: None ANESTHESIA/SEDATION: Moderate (conscious) sedation was employed during this procedure. A total of Versed 3.0 mg and Fentanyl 100 mcg was administered intravenously. Moderate Sedation Time: 99 minutes. The patient's level of consciousness and vital signs were monitored continuously by radiology nursing throughout the procedure under my direct supervision. CONTRAST:  75 mL Omnipaque 350 FLUOROSCOPY TIME:  Fluoroscopy Time: 33 minutes.  716 mGy COMPLICATIONS: None immediate. PROCEDURE: Informed consent was obtained from the patient's husband following explanation of the procedure, risks, benefits and  alternatives. The patient and her husband understand, agree and consent for  the procedure. All questions were addressed. The husband signed consent due to patient history of history of mild dementia. A time out was performed prior to the initiation of the procedure. Maximal barrier sterile technique utilized including caps, mask, sterile gowns, sterile gloves, large sterile drape, hand hygiene, and chlorhexidine prep. Ultrasound was used to confirm patency of the right basilic vein in the upper arm. Local anesthesia was provided with 1% lidocaine. Under ultrasound guidance, a 21 gauge needle was utilized to access the vein. Ultrasound image documentation was performed. A guidewire was advanced. A 4 French micropuncture dilator was advanced into the vein and secured for use as intravenous access. Ultrasound was used to confirm patency of the right common femoral artery. Local anesthesia was provided with 1% lidocaine. Under ultrasound guidance, a 21 gauge needle was advanced into the right common femoral artery. Ultrasound image documentation was performed. After placement of a micropuncture dilator, a 5 French sheath was then placed over a guidewire. A 5 French Cobra catheter was advanced into the abdominal aorta and used to selectively catheterize the superior mesenteric artery. Selective arteriography of the superior mesenteric artery was performed in different projections. A microcatheter was then advanced through the 5 French catheter and into the trunk of the superior mesenteric artery. A third order branch vessel was catheterized and selective arteriography performed. A separate second order SMA branch was then catheterized with a microcatheter followed by further catheterization of a third order branch. Selective arteriography was performed. The microcatheter was then used to further catheterize a fourth order branch. Selective arteriography was performed. Transcatheter embolization was performed with  deployment of a single 1 mm x 2 cm [place] LP coil. Additional follow-up arteriography was performed. After catheter removal and sheath removal, hemostasis was obtained with use of the Cordis ExoSeal device. FINDINGS: Intravenous access was successfully stab list at the level of the right basilic vein in the upper arm. This access was utilized for conscious sedation during the procedure and was also kept in place for use during hospital admission. Initial selective arteriography of the superior mesenteric artery demonstrated normal patency of branch vessels without evidence of active bleeding, pseudoaneurysm, vascular malformation or other vascular abnormality. On the third selective SMA arteriogram, there was evidence active contrast extravasation at the level of the mid to distal transverse colon in the region of bleeding identified by CT angiography. 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 After mapping out the probable route of supply to the transverse colon, a tortuous proximal branch was eventually able to be catheterized that supplies the transverse colon. Initial selective arteriography at the level of this branch did not re-demonstrate active bleeding. However, with further advancement of the catheter into the origin of a fourth order descending transverse colonic branch, active extravasation of contrast material was again demonstrated. The microcatheter could not be advanced beyond the origin of this branch vessel due to tortuosity and small vessel caliber as well as probable component of spasm. Therefore, coil embolization was performed at the base of this vessel resulting in occlusion as well as some additional branch vessels distal to the coil. Follow-up arteriography did not demonstrate active extravasation at the level of previously demonstrated bleeding.  IMPRESSION: 1. Additional peripheral intravenous access established under ultrasound guidance for use for IV conscious sedation during the procedure. 2. Arteriography of the superior mesenteric artery as well as multiple distal  branch vessel supply demonstrates active contrast extravasation from a fourth order descending branch supplying the transverse colon at the level of active bleeding identified by CT angiography. The origin of this branch vessel was able to be successfully catheterized with deployment of a single embolization coil resulting in branch vessel occlusion and lack of contrast extravasation following embolization. Electronically Signed   By: Irish Lack M.D.   On: 08/29/2021 08:36   CT ANGIO GI BLEED  Result Date: 08/27/2021 CLINICAL DATA:  GI bleed. EXAM: CTA ABDOMEN AND PELVIS WITHOUT AND WITH CONTRAST TECHNIQUE: Multidetector CT imaging of the abdomen and pelvis was performed using the standard protocol during bolus administration of intravenous contrast. Multiplanar reconstructed images and MIPs were obtained and reviewed to evaluate the vascular anatomy. CONTRAST:  OMNIPAQUE IOHEXOL 350 MG/ML SOLN COMPARISON:  Abdominal CTA 2 days ago 08/25/2021 FINDINGS: VASCULAR Aorta: Mild atherosclerosis. No aneurysm, dissection, or significant stenosis. No periaortic stranding or inflammation. Celiac: The right hepatic artery has a separate origin arising from the abdominal aorta. Celiac artery is patent. Distal branch vessels are patent. SMA: Patent without evidence of aneurysm, dissection, vasculitis or significant stenosis. Renals: Mild calcified plaque in the origin of the renal arteries without significant stenosis. No acute findings or change from prior. Single bilateral renal arteries. IMA: Patent. Inflow: Patent without evidence of aneurysm, dissection, vasculitis or significant stenosis. Iliac tortuosity again seen. Proximal Outflow: Bilateral common femoral and visualized portions of  the superficial and profunda femoral arteries are patent without evidence of aneurysm, dissection, vasculitis or significant stenosis. Veins: No obvious venous abnormality within the limitations of this arterial phase study. Review of the MIP images confirms the above findings. NON-VASCULAR Lower chest: Lung bases are clear. No acute airspace disease or pleural effusion. Hepatobiliary: No focal hepatic abnormality. There is vicarious excretion of IV contrast in the gallbladder accounting for hyperdense appearance on the current exam. No biliary dilatation or pericholecystic inflammation. Pancreas: No ductal dilatation or inflammation. Spleen: Normal in size without focal abnormality. Adrenals/Urinary Tract: No adrenal nodule. Homogeneous renal enhancement without hydronephrosis. Again seen right renal cysts. No suspicious renal lesion. Unremarkable urinary bladder. Stomach/Bowel: Examination is positive for active extravasation of contrast into the GI tract. There is extravasation of contrast into the mid transverse colon, series 10, image 94, that increases on venous phase. No additional sites of IV contrast extravasation. Diffuse colonic diverticulosis without diverticulitis. Normal appendix. Lymphatic: No abdominopelvic adenopathy. Reproductive: Partial hysterectomy with prominence of the vaginal cuff unchanged. No adnexal mass. Other: No ascites or free air. Small fat containing umbilical hernia. Small bilateral fat containing inguinal hernias. Musculoskeletal: There are no acute or suspicious osseous abnormalities. Transitional lumbosacral anatomy with enlarged transverse processes and pseudoarticulation of the sacrum with L5. Mild bilateral hip osteoarthritis. IMPRESSION: VASCULAR 1. Examination is positive for active GI bleed in the mid distal transverse colon. 2. Mild aortic atherosclerosis. NON-VASCULAR 1. Colonic diverticulosis without focal diverticulitis. 2. Small fat containing bilateral inguinal and  umbilical hernias. Electronically Signed   By: Narda Rutherford M.D.   On: 08/27/2021 21:31    Labs:  CBC: Recent Labs    08/25/21 0228 08/25/21 1125 08/26/21 0731 08/26/21 1311 08/28/21 0549 08/28/21 1450 08/29/21 0328 08/29/21 2058  WBC 9.9  --  8.1  --   --  17.1* 14.3*  --   HGB 8.4*   < > 7.2*   < > 8.5* 7.7* 6.3* 9.2*  HCT 26.1*   < > 22.4*   < > 25.8* 23.3* 19.3*  27.8*  PLT 211  --  207  --   --  134* 135*  --    < > = values in this interval not displayed.     COAGS: Recent Labs    08/24/21 1450  INR 1.1     BMP: Recent Labs    08/25/21 0228 08/25/21 1844 08/26/21 0647 08/28/21 1450 08/29/21 0328  NA 139  --  137 139 138  K 4.2  --  3.5 3.9 3.6  CL 107  --  108 109 106  CO2 24  --  23 25 24   GLUCOSE 101*  --  114* 145* 125*  BUN 12 8 5* 11 13  CALCIUM 9.1  --  8.6* 8.3* 8.4*  CREATININE 0.53  --  0.65 0.80 0.73  GFRNONAA >60  --  >60 >60 >60     LIVER FUNCTION TESTS: Recent Labs    08/24/21 1450 08/26/21 0647  BILITOT 0.6 0.1*  AST 17 18  ALT 10 10  ALKPHOS 33* 30*  PROT 6.2* 5.6*  ALBUMIN 3.2* 2.8*     Assessment and Plan:  75 y.o. female  with history of pan diverticulosis. Presented to the ED on 9.2.22 with 3 days of hematochezia. Initially CTA performed on  9.3.22 negative for active extravasation. The Patient began to re bleed on 9.5.22 and second CTA performed on 9.5.22 reads positive for active GI bleed in the mid distal transverse colon.  IR performed an arteriogram on 9.6.22 with catheterization and single coil embolization of the fourth order descending branch supplying the transverse colon. Patient has denies any bowel movements but has had a drop in hemoglobin to 6.6 and has since had 2 units of PR BC transfused. Post transfusion H/H 9.2 and 27.8.   Paitent appears to be hemodynamically stable with stable vital signs and H/H.  Reports no s/s of acute bleeding.  R CFA puncture site shows no s/s of bleeding or infection.     Further plan per [facility]/ GI  Appreciate and agree with plan.  Please call IR for questions and concerns.    Electronically Signed: 11.5.22, PA-C 08/30/2021, 10:58 AM   I spent a total of 15 Minutes at the patient's bedside AND on the patient's hospital floor or unit, greater than 50% of which was counseling/coordinating care for intra abdominal angiogram with emobolization on 9.6.22

## 2021-08-31 LAB — HEMOGLOBIN AND HEMATOCRIT, BLOOD
HCT: 25.9 % — ABNORMAL LOW (ref 36.0–46.0)
HCT: 30.6 % — ABNORMAL LOW (ref 36.0–46.0)
Hemoglobin: 8.4 g/dL — ABNORMAL LOW (ref 12.0–15.0)
Hemoglobin: 9.9 g/dL — ABNORMAL LOW (ref 12.0–15.0)

## 2021-08-31 LAB — PREPARE RBC (CROSSMATCH)

## 2021-08-31 MED ORDER — SODIUM CHLORIDE 0.9% IV SOLUTION: Freq: Once | INTRAVENOUS | Status: AC

## 2021-08-31 NOTE — Discharge Summary (Signed)
Discharge Summary  Doris Boyd:[id number] DOB: 1946-04-27  PCP: Marden Noble, MD  Admit date: 08/24/2021 Discharge date: 08/31/2021  Time spent: 35 minutes   Recommendations for Outpatient Follow-up:  Follow-up with GI Follow-up with IR Repeat CBC in 1 week at PCPs office.  Discharge Diagnoses:  Active Hospital Problems   Diagnosis Date Noted   GI bleed 08/24/2021   Acute blood loss anemia 08/24/2021    Resolved Hospital Problems  No resolved problems to display.    Discharge Condition: Stable  Diet recommendation: Resume previous diet.  Vitals:   08/31/21 1245 08/31/21 1808  BP: 128/86 (!) 111/56  Pulse: 70 81  Resp: 17 16  Temp: 97.6 F (36.4 C) 98.2 F (36.8 C)  SpO2: 97% 100%    History of present illness:  Doris Boyd is a 75 y.o. female with a history of pandiverticulosis who takes no medications and presented to the ED 9/2 with 3 days of painless red blood in stool turning more dark over time. In the ED she was orthostatic with hgb 8.1g/dl (from 44W/NU on 2/72), +FOBT, so GI was consulted, 1u PRBCs transfused, and patient admitted. Bleeding initially seemed to improve and CTA showed no source of bleeding. Unfortunately, she has again developed frequent stools characterized as dark and red bloody. PPI was started empirically and pt made NPO. An additional unit of blood was given 9/4. Over the course of 9/5, hematochezia returned. 2 more units of PRBCs transfused. CTA repeated and positive for bleeding in mid-transverse colon. IR subsequently performed embolization of branch vessel of SMA supplying distal transverse colon.    08/29/2021: Seen and examined at her bedside.  Her husband was present in the room.  She reported generalized weakness and fatigue.  Blood being transfused in the room.  Hemoglobin 6.3 this morning, 2 unit PRBCs ordered to be transfused.  She denies any bowel movements since waking up this morning, at the time of this visit.   08/30/2021:  She has recurrent GI bleed this afternoon reported 2 bloody stools.  Held off her discharge.  We will monitor overnight and repeat H&H in the morning.  08/31/2021: Seen at her bedside.  Her husband was present in the room.  No further bleeding.  No complaints.  Hemoglobin 8.4.  1 unit PRBC ordered to be transfused.  Seen by IR, okay to go home.  Repeat CBC outpatient in 1 week at PCPs office.  Hospital Course:  Principal Problem:   GI bleed Active Problems:   Acute blood loss anemia  Acute blood loss anemia due transverse colon diverticular hemorrhage, pandiverticulosis:  s/p coil embolization of supplying SMA branch on 9/6 by IR.  Has received 5 units PRBCs during this admission. Repeat CBC outpatient in 1 week at your PCPs office.   Leukocytosis, likely reactive in the setting of severe anemia WBC downtrending 14.3 from 17.1. No evidence of active infective process Follow-up with your PCP.  Mild thrombocytopenia Platelet count uptrending 135 from 134. Follow-up with your PCP.   Code Status: Full Family Communication: Husband at bedside.   Consultants:  [facility]   Procedures:  1u PRBC 9/2 1u PRBC 9/4 2u PRBC 9/5 1 unit PRBC 08/31/2021. 08/28/2021 Dr. Fredia Sorrow: Ultrasound guided venous access, superior mesenteric arteriography, arteriography of transverse colonic arterial supply, embolization of SMA branch supplying transverse colon    Discharge Exam: BP (!) 111/56 (BP Location: Left Arm)   Pulse 81   Temp 98.2 F (36.8 C) (Oral)  Resp 16   SpO2 100%  General: 75 y.o. year-old female well developed well nourished in no acute distress.  Alert and oriented x3. Cardiovascular: Regular rate and rhythm with no rubs or gallops.  No thyromegaly or JVD noted.   Respiratory: Clear to auscultation with no wheezes or rales. Good inspiratory effort. Abdomen: Soft nontender nondistended with normal bowel sounds x4 quadrants. Musculoskeletal: No lower extremity  edema. 2/4 pulses in all 4 extremities. Skin: No ulcerative lesions noted or rashes, Psychiatry: Mood is appropriate for condition and setting  Discharge Instructions You were cared for by a hospitalist during your hospital stay. If you have any questions about your discharge medications or the care you received while you were in the hospital after you are discharged, you can call the unit and asked to speak with the hospitalist on call if the hospitalist that took care of you is not available. Once you are discharged, your primary care physician will handle any further medical issues. Please note that NO REFILLS for any discharge medications will be authorized once you are discharged, as it is imperative that you return to your primary care physician (or establish a relationship with a primary care physician if you do not have one) for your aftercare needs so that they can reassess your need for medications and monitor your lab values.   Allergies as of 08/31/2021       Reactions   Ciprofloxacin Other (See Comments)   Muscle aches        Medication List     STOP taking these medications    benzonatate 100 MG capsule Commonly known as: TESSALON   NONFORMULARY OR COMPOUNDED ITEM       TAKE these medications    acetaminophen 500 MG tablet Commonly known as: TYLENOL Take 500-1,000 mg by mouth every 8 (eight) hours as needed for moderate pain, headache or mild pain.       Allergies  Allergen Reactions   Ciprofloxacin Other (See Comments)    Muscle aches    Follow-up Information     Marden Noble, MD. Call today.   Specialty: Internal Medicine Why: Please call for a posthospital follow-up appointment. Contact information: [address] [place] [address] [phone]         Kathi Der, MD. Call today.   Specialty: Gastroenterology Why: Please call for a post hospital follow-up appointment. Contact information: [address] [place] [address] [phone]         Irish Lack, MD. Call today.   Specialties: Interventional Radiology, Radiology Why: Please call for a post hospital follow-up appointment. Contact information: [address] [place] [address] [phone]                  The results of significant diagnostics from this hospitalization (including imaging, microbiology, ancillary and laboratory) are listed below for reference.    Significant Diagnostic Studies: IR Angiogram Visceral Selective  Result Date: 08/29/2021 INDICATION: Lower GI bleed and hypotension with positive CT angiography demonstrating active extravasation of contrast from a branch supplying the distal transverse colon. The patient has a single current functioning IV through which she is receiving blood. There has been inability to establish a second peripheral IV. EXAM: 1. ULTRASOUND-GUIDED PERIPHERAL INTRAVENOUS ACCESS 2. ULTRASOUND GUIDANCE FOR VASCULAR ACCESS OF THE RIGHT COMMON FEMORAL ARTERY 3. SELECTIVE ARTERIOGRAPHY OF THE SUPERIOR MESENTERIC ARTERY 4. ADDITIONAL SELECTIVE ARTERIOGRAPHY OF A THIRD ORDER SMA BRANCH 5. ADDITIONAL SELECTIVE  ARTERIOGRAPHY A THIRD ORDER SMA BRANCH 6. ADDITIONAL SELECTIVE ARTERIOGRAPHY A FOURTH ORDER SMA BRANCH 7. TRANSCATHETER EMBOLIZATION OF SMA BRANCH 8. FOLLOW-UP ARTERIOGRAPHY AFTER EMBOLIZATION MEDICATIONS: None ANESTHESIA/SEDATION: Moderate (conscious) sedation was employed during this procedure. A total of Versed 3.0 mg and Fentanyl 100 mcg was administered intravenously. Moderate Sedation Time: 99 minutes. The patient's level of consciousness and vital signs were monitored continuously by radiology nursing throughout the procedure under my direct supervision. CONTRAST:  75 mL Omnipaque 350 FLUOROSCOPY TIME:  Fluoroscopy Time: 33 minutes.  716 mGy COMPLICATIONS: None immediate. PROCEDURE: Informed consent was obtained from the patient's husband following  explanation of the procedure, risks, benefits and alternatives. The patient and her husband understand, agree and consent for the procedure. All questions were addressed. The husband signed consent due to patient history of history of mild dementia. A time out was performed prior to the initiation of the procedure. Maximal barrier sterile technique utilized including caps, mask, sterile gowns, sterile gloves, large sterile drape, hand hygiene, and chlorhexidine prep. Ultrasound was used to confirm patency of the right basilic vein in the upper arm. Local anesthesia was provided with 1% lidocaine. Under ultrasound guidance, a 21 gauge needle was utilized to access the vein. Ultrasound image documentation was performed. A guidewire was advanced. A 4 French micropuncture dilator was advanced into the vein and secured for use as intravenous access. Ultrasound was used to confirm patency of the right common femoral artery. Local anesthesia was provided with 1% lidocaine. Under ultrasound guidance, a 21 gauge needle was advanced into the right common femoral artery. Ultrasound image documentation was performed. After placement of a micropuncture dilator, a 5 French sheath was then placed over a guidewire. A 5 French Cobra catheter was advanced into the abdominal aorta and used to selectively catheterize the superior mesenteric artery. Selective arteriography of the superior mesenteric artery was performed in different projections. A microcatheter was then advanced through the 5 French catheter and into the trunk of the superior mesenteric artery. A third order branch vessel was catheterized and selective arteriography performed. A separate second order SMA branch was then catheterized with a microcatheter followed by further catheterization of a third order branch. Selective arteriography was performed. The microcatheter was then used to further catheterize a fourth order branch. Selective arteriography was performed.  Transcatheter embolization was performed with deployment of a single 1 mm x 2 cm Kamira LP coil. Additional follow-up arteriography was performed. After catheter removal and sheath removal, hemostasis was obtained with use of the Cordis ExoSeal device. FINDINGS: Intravenous access was successfully stab list at the level of the right basilic vein in the upper arm. This access was utilized for conscious sedation during the procedure and was also kept in place for use during hospital admission. Initial selective arteriography of the superior mesenteric artery demonstrated normal patency of branch vessels without evidence of active bleeding, pseudoaneurysm, vascular malformation or other vascular abnormality. On the third selective SMA arteriogram, there was evidence active contrast extravasation at the level of the mid to distal transverse colon in the region of bleeding identified by CT angiography. 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 After mapping out the probable route of supply to the transverse colon, a tortuous proximal branch was eventually able to be catheterized that supplies the transverse colon. Initial selective arteriography at the level of this branch did not re-demonstrate active bleeding.  However, with further advancement of the catheter into the origin of a fourth order descending transverse colonic branch, active extravasation of contrast material was again demonstrated. The microcatheter could not be advanced beyond the origin of this branch vessel due to tortuosity and small vessel caliber as well as probable component of spasm. Therefore, coil embolization was performed at the base of this vessel resulting in occlusion as well as some additional branch vessels distal to the coil. Follow-up arteriography did not demonstrate active extravasation at  the level of previously demonstrated bleeding. IMPRESSION: 1. Additional peripheral intravenous access established under ultrasound guidance for use for IV conscious sedation during the procedure. 2. Arteriography of the superior mesenteric artery as well as multiple distal branch vessel supply demonstrates active contrast extravasation from a fourth order descending branch supplying the transverse colon at the level of active bleeding identified by CT angiography. The origin of this branch vessel was able to be successfully catheterized with deployment of a single embolization coil resulting in branch vessel occlusion and lack of contrast extravasation following embolization. Electronically Signed   By: Irish Lack M.D.   On: 08/29/2021 08:36   IR Angiogram Selective Each Additional Vessel  Result Date: 08/29/2021 INDICATION: Lower GI bleed and hypotension with positive CT angiography demonstrating active extravasation of contrast from a branch supplying the distal transverse colon. The patient has a single current functioning IV through which she is receiving blood. There has been inability to establish a second peripheral IV. EXAM: 1. ULTRASOUND-GUIDED PERIPHERAL INTRAVENOUS ACCESS 2. ULTRASOUND GUIDANCE FOR VASCULAR ACCESS OF THE RIGHT COMMON FEMORAL ARTERY 3. SELECTIVE ARTERIOGRAPHY OF THE SUPERIOR MESENTERIC ARTERY 4. ADDITIONAL SELECTIVE ARTERIOGRAPHY OF A THIRD ORDER SMA BRANCH 5. ADDITIONAL SELECTIVE ARTERIOGRAPHY A THIRD ORDER SMA BRANCH 6. ADDITIONAL SELECTIVE ARTERIOGRAPHY A FOURTH ORDER SMA BRANCH 7. TRANSCATHETER EMBOLIZATION OF SMA BRANCH 8. FOLLOW-UP ARTERIOGRAPHY AFTER EMBOLIZATION MEDICATIONS: None ANESTHESIA/SEDATION: Moderate (conscious) sedation was employed during this procedure. A total of Versed 3.0 mg and Fentanyl 100 mcg was administered intravenously. Moderate Sedation Time: 99 minutes. The patient's level of consciousness and vital signs were monitored continuously by radiology  nursing throughout the procedure under my direct supervision. CONTRAST:  75 mL Omnipaque 350 FLUOROSCOPY TIME:  Fluoroscopy Time: 33 minutes.  716 mGy COMPLICATIONS: None immediate. PROCEDURE: Informed consent was obtained from the patient's husband following explanation of the procedure, risks, benefits and alternatives. The patient and her husband understand, agree and consent for the procedure. All questions were addressed. The husband signed consent due to patient history of history of mild dementia. A time out was performed prior to the initiation of the procedure. Maximal barrier sterile technique utilized including caps, mask, sterile gowns, sterile gloves, large sterile drape, hand hygiene, and chlorhexidine prep. Ultrasound was used to confirm patency of the right basilic vein in the upper arm. Local anesthesia was provided with 1% lidocaine. Under ultrasound guidance, a 21 gauge needle was utilized to access the vein. Ultrasound image documentation was performed. A guidewire was advanced. A 4 French micropuncture dilator was advanced into the vein and secured for use as intravenous access. Ultrasound was used to confirm patency of the right common femoral artery. Local anesthesia was provided with 1% lidocaine. Under ultrasound guidance, a 21 gauge needle was advanced into the right common femoral artery. Ultrasound image documentation was performed. After placement of a micropuncture dilator, a 5 French sheath was then placed over a guidewire. A 5 French Cobra catheter was advanced into the abdominal aorta and used to selectively  catheterize the superior mesenteric artery. Selective arteriography of the superior mesenteric artery was performed in different projections. A microcatheter was then advanced through the 5 French catheter and into the trunk of the superior mesenteric artery. A third order branch vessel was catheterized and selective arteriography performed. A separate second order SMA branch was  then catheterized with a microcatheter followed by further catheterization of a third order branch. Selective arteriography was performed. The microcatheter was then used to further catheterize a fourth order branch. Selective arteriography was performed. Transcatheter embolization was performed with deployment of a single 1 mm x 2 cm Kip LP coil. Additional follow-up arteriography was performed. After catheter removal and sheath removal, hemostasis was obtained with use of the Cordis ExoSeal device. FINDINGS: Intravenous access was successfully stab list at the level of the right basilic vein in the upper arm. This access was utilized for conscious sedation during the procedure and was also kept in place for use during hospital admission. Initial selective arteriography of the superior mesenteric artery demonstrated normal patency of branch vessels without evidence of active bleeding, pseudoaneurysm, vascular malformation or other vascular abnormality. On the third selective SMA arteriogram, there was evidence active contrast extravasation at the level of the mid to distal transverse colon in the region of bleeding identified by CT angiography. 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 After mapping out the probable route of supply to the transverse colon, a tortuous proximal branch was eventually able to be catheterized that supplies the transverse colon. Initial selective arteriography at the level of this branch did not re-demonstrate active bleeding. However, with further advancement of the catheter into the origin of a fourth order descending transverse colonic branch, active extravasation of contrast material was again demonstrated. The microcatheter could not be advanced beyond the origin of this branch vessel due to tortuosity and small vessel caliber as well  as probable component of spasm. Therefore, coil embolization was performed at the base of this vessel resulting in occlusion as well as some additional branch vessels distal to the coil. Follow-up arteriography did not demonstrate active extravasation at the level of previously demonstrated bleeding. IMPRESSION: 1. Additional peripheral intravenous access established under ultrasound guidance for use for IV conscious sedation during the procedure. 2. Arteriography of the superior mesenteric artery as well as multiple distal branch vessel supply demonstrates active contrast extravasation from a fourth order descending branch supplying the transverse colon at the level of active bleeding identified by CT angiography. The origin of this branch vessel was able to be successfully catheterized with deployment of a single embolization coil resulting in branch vessel occlusion and lack of contrast extravasation following embolization. Electronically Signed   By: Irish Lack M.D.   On: 08/29/2021 08:36   IR Angiogram Selective Each Additional Vessel  Result Date: 08/29/2021 INDICATION: Lower GI bleed and hypotension with positive CT angiography demonstrating active extravasation of contrast from a branch supplying the distal transverse colon. The patient has a single current functioning IV through which she is receiving blood. There has been inability to establish a second peripheral IV. EXAM: 1. ULTRASOUND-GUIDED PERIPHERAL INTRAVENOUS ACCESS 2. ULTRASOUND GUIDANCE FOR VASCULAR ACCESS OF THE RIGHT COMMON FEMORAL ARTERY 3. SELECTIVE ARTERIOGRAPHY OF THE SUPERIOR MESENTERIC ARTERY 4. ADDITIONAL SELECTIVE ARTERIOGRAPHY OF A THIRD ORDER SMA BRANCH 5. ADDITIONAL SELECTIVE ARTERIOGRAPHY A THIRD ORDER SMA BRANCH 6. ADDITIONAL SELECTIVE ARTERIOGRAPHY A FOURTH  ORDER SMA BRANCH 7. TRANSCATHETER EMBOLIZATION OF SMA BRANCH 8. FOLLOW-UP ARTERIOGRAPHY AFTER EMBOLIZATION MEDICATIONS: None ANESTHESIA/SEDATION: Moderate (conscious)  sedation was employed during this procedure. A total of Versed 3.0 mg and Fentanyl 100 mcg was administered intravenously. Moderate Sedation Time: 99 minutes. The patient's level of consciousness and vital signs were monitored continuously by radiology nursing throughout the procedure under my direct supervision. CONTRAST:  75 mL Omnipaque 350 FLUOROSCOPY TIME:  Fluoroscopy Time: 33 minutes.  716 mGy COMPLICATIONS: None immediate. PROCEDURE: Informed consent was obtained from the patient's husband following explanation of the procedure, risks, benefits and alternatives. The patient and her husband understand, agree and consent for the procedure. All questions were addressed. The husband signed consent due to patient history of history of mild dementia. A time out was performed prior to the initiation of the procedure. Maximal barrier sterile technique utilized including caps, mask, sterile gowns, sterile gloves, large sterile drape, hand hygiene, and chlorhexidine prep. Ultrasound was used to confirm patency of the right basilic vein in the upper arm. Local anesthesia was provided with 1% lidocaine. Under ultrasound guidance, a 21 gauge needle was utilized to access the vein. Ultrasound image documentation was performed. A guidewire was advanced. A 4 French micropuncture dilator was advanced into the vein and secured for use as intravenous access. Ultrasound was used to confirm patency of the right common femoral artery. Local anesthesia was provided with 1% lidocaine. Under ultrasound guidance, a 21 gauge needle was advanced into the right common femoral artery. Ultrasound image documentation was performed. After placement of a micropuncture dilator, a 5 French sheath was then placed over a guidewire. A 5 French Cobra catheter was advanced into the abdominal aorta and used to selectively catheterize the superior mesenteric artery. Selective arteriography of the superior mesenteric artery was performed in different  projections. A microcatheter was then advanced through the 5 French catheter and into the trunk of the superior mesenteric artery. A third order branch vessel was catheterized and selective arteriography performed. A separate second order SMA branch was then catheterized with a microcatheter followed by further catheterization of a third order branch. Selective arteriography was performed. The microcatheter was then used to further catheterize a fourth order branch. Selective arteriography was performed. Transcatheter embolization was performed with deployment of a single 1 mm x 2 cm Tanisa LP coil. Additional follow-up arteriography was performed. After catheter removal and sheath removal, hemostasis was obtained with use of the Cordis ExoSeal device. FINDINGS: Intravenous access was successfully stab list at the level of the right basilic vein in the upper arm. This access was utilized for conscious sedation during the procedure and was also kept in place for use during hospital admission. Initial selective arteriography of the superior mesenteric artery demonstrated normal patency of branch vessels without evidence of active bleeding, pseudoaneurysm, vascular malformation or other vascular abnormality. On the third selective SMA arteriogram, there was evidence active contrast extravasation at the level of the mid to distal transverse colon in the region of bleeding identified by CT angiography. 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 After mapping out the probable route of supply to the transverse colon, a tortuous proximal branch was eventually able to be catheterized that supplies the transverse colon. Initial selective arteriography at the level of this branch did not re-demonstrate active bleeding. However, with further advancement of the catheter into the origin  of  a fourth order descending transverse colonic branch, active extravasation of contrast material was again demonstrated. The microcatheter could not be advanced beyond the origin of this branch vessel due to tortuosity and small vessel caliber as well as probable component of spasm. Therefore, coil embolization was performed at the base of this vessel resulting in occlusion as well as some additional branch vessels distal to the coil. Follow-up arteriography did not demonstrate active extravasation at the level of previously demonstrated bleeding. IMPRESSION: 1. Additional peripheral intravenous access established under ultrasound guidance for use for IV conscious sedation during the procedure. 2. Arteriography of the superior mesenteric artery as well as multiple distal branch vessel supply demonstrates active contrast extravasation from a fourth order descending branch supplying the transverse colon at the level of active bleeding identified by CT angiography. The origin of this branch vessel was able to be successfully catheterized with deployment of a single embolization coil resulting in branch vessel occlusion and lack of contrast extravasation following embolization. Electronically Signed   By: Irish Lack M.D.   On: 08/29/2021 08:36   IR Angiogram Selective Each Additional Vessel  Result Date: 08/29/2021 INDICATION: Lower GI bleed and hypotension with positive CT angiography demonstrating active extravasation of contrast from a branch supplying the distal transverse colon. The patient has a single current functioning IV through which she is receiving blood. There has been inability to establish a second peripheral IV. EXAM: 1. ULTRASOUND-GUIDED PERIPHERAL INTRAVENOUS ACCESS 2. ULTRASOUND GUIDANCE FOR VASCULAR ACCESS OF THE RIGHT COMMON FEMORAL ARTERY 3. SELECTIVE ARTERIOGRAPHY OF THE SUPERIOR MESENTERIC ARTERY 4. ADDITIONAL SELECTIVE ARTERIOGRAPHY OF A THIRD ORDER SMA BRANCH 5. ADDITIONAL SELECTIVE  ARTERIOGRAPHY A THIRD ORDER SMA BRANCH 6. ADDITIONAL SELECTIVE ARTERIOGRAPHY A FOURTH ORDER SMA BRANCH 7. TRANSCATHETER EMBOLIZATION OF SMA BRANCH 8. FOLLOW-UP ARTERIOGRAPHY AFTER EMBOLIZATION MEDICATIONS: None ANESTHESIA/SEDATION: Moderate (conscious) sedation was employed during this procedure. A total of Versed 3.0 mg and Fentanyl 100 mcg was administered intravenously. Moderate Sedation Time: 99 minutes. The patient's level of consciousness and vital signs were monitored continuously by radiology nursing throughout the procedure under my direct supervision. CONTRAST:  75 mL Omnipaque 350 FLUOROSCOPY TIME:  Fluoroscopy Time: 33 minutes.  716 mGy COMPLICATIONS: None immediate. PROCEDURE: Informed consent was obtained from the patient's husband following explanation of the procedure, risks, benefits and alternatives. The patient and her husband understand, agree and consent for the procedure. All questions were addressed. The husband signed consent due to patient history of history of mild dementia. A time out was performed prior to the initiation of the procedure. Maximal barrier sterile technique utilized including caps, mask, sterile gowns, sterile gloves, large sterile drape, hand hygiene, and chlorhexidine prep. Ultrasound was used to confirm patency of the right basilic vein in the upper arm. Local anesthesia was provided with 1% lidocaine. Under ultrasound guidance, a 21 gauge needle was utilized to access the vein. Ultrasound image documentation was performed. A guidewire was advanced. A 4 French micropuncture dilator was advanced into the vein and secured for use as intravenous access. Ultrasound was used to confirm patency of the right common femoral artery. Local anesthesia was provided with 1% lidocaine. Under ultrasound guidance, a 21 gauge needle was advanced into the right common femoral artery. Ultrasound image documentation was performed. After placement of a micropuncture dilator, a 5 French  sheath was then placed over a guidewire. A 5 French Cobra catheter was advanced into the abdominal aorta and used to selectively catheterize the superior mesenteric artery. Selective arteriography of the superior mesenteric  artery was performed in different projections. A microcatheter was then advanced through the 5 French catheter and into the trunk of the superior mesenteric artery. A third order branch vessel was catheterized and selective arteriography performed. A separate second order SMA branch was then catheterized with a microcatheter followed by further catheterization of a third order branch. Selective arteriography was performed. The microcatheter was then used to further catheterize a fourth order branch. Selective arteriography was performed. Transcatheter embolization was performed with deployment of a single 1 mm x 2 cm Evelin LP coil. Additional follow-up arteriography was performed. After catheter removal and sheath removal, hemostasis was obtained with use of the Cordis ExoSeal device. FINDINGS: Intravenous access was successfully stab list at the level of the right basilic vein in the upper arm. This access was utilized for conscious sedation during the procedure and was also kept in place for use during hospital admission. Initial selective arteriography of the superior mesenteric artery demonstrated normal patency of branch vessels without evidence of active bleeding, pseudoaneurysm, vascular malformation or other vascular abnormality. On the third selective SMA arteriogram, there was evidence active contrast extravasation at the level of the mid to distal transverse colon in the region of bleeding identified by CT angiography. 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 After mapping out the probable route of supply to the transverse colon, a tortuous  proximal branch was eventually able to be catheterized that supplies the transverse colon. Initial selective arteriography at the level of this branch did not re-demonstrate active bleeding. However, with further advancement of the catheter into the origin of a fourth order descending transverse colonic branch, active extravasation of contrast material was again demonstrated. The microcatheter could not be advanced beyond the origin of this branch vessel due to tortuosity and small vessel caliber as well as probable component of spasm. Therefore, coil embolization was performed at the base of this vessel resulting in occlusion as well as some additional branch vessels distal to the coil. Follow-up arteriography did not demonstrate active extravasation at the level of previously demonstrated bleeding. IMPRESSION: 1. Additional peripheral intravenous access established under ultrasound guidance for use for IV conscious sedation during the procedure. 2. Arteriography of the superior mesenteric artery as well as multiple distal branch vessel supply demonstrates active contrast extravasation from a fourth order descending branch supplying the transverse colon at the level of active bleeding identified by CT angiography. The origin of this branch vessel was able to be successfully catheterized with deployment of a single embolization coil resulting in branch vessel occlusion and lack of contrast extravasation following embolization. Electronically Signed   By: Irish Lack M.D.   On: 08/29/2021 08:36   IR Angiogram Follow Up Study  Result Date: 08/29/2021 INDICATION: Lower GI bleed and hypotension with positive CT angiography demonstrating active extravasation of contrast from a branch supplying the distal transverse colon. The patient has a single current functioning IV through which she is receiving blood. There has been inability to establish a second peripheral IV. EXAM: 1. ULTRASOUND-GUIDED PERIPHERAL  INTRAVENOUS ACCESS 2. ULTRASOUND GUIDANCE FOR VASCULAR ACCESS OF THE RIGHT COMMON FEMORAL ARTERY 3. SELECTIVE ARTERIOGRAPHY OF THE SUPERIOR MESENTERIC ARTERY 4. ADDITIONAL SELECTIVE ARTERIOGRAPHY OF A THIRD ORDER SMA BRANCH 5. ADDITIONAL SELECTIVE ARTERIOGRAPHY A THIRD ORDER SMA BRANCH 6. ADDITIONAL SELECTIVE ARTERIOGRAPHY A FOURTH ORDER SMA BRANCH 7. TRANSCATHETER EMBOLIZATION OF SMA BRANCH 8. FOLLOW-UP ARTERIOGRAPHY  AFTER EMBOLIZATION MEDICATIONS: None ANESTHESIA/SEDATION: Moderate (conscious) sedation was employed during this procedure. A total of Versed 3.0 mg and Fentanyl 100 mcg was administered intravenously. Moderate Sedation Time: 99 minutes. The patient's level of consciousness and vital signs were monitored continuously by radiology nursing throughout the procedure under my direct supervision. CONTRAST:  75 mL Omnipaque 350 FLUOROSCOPY TIME:  Fluoroscopy Time: 33 minutes.  716 mGy COMPLICATIONS: None immediate. PROCEDURE: Informed consent was obtained from the patient's husband following explanation of the procedure, risks, benefits and alternatives. The patient and her husband understand, agree and consent for the procedure. All questions were addressed. The husband signed consent due to patient history of history of mild dementia. A time out was performed prior to the initiation of the procedure. Maximal barrier sterile technique utilized including caps, mask, sterile gowns, sterile gloves, large sterile drape, hand hygiene, and chlorhexidine prep. Ultrasound was used to confirm patency of the right basilic vein in the upper arm. Local anesthesia was provided with 1% lidocaine. Under ultrasound guidance, a 21 gauge needle was utilized to access the vein. Ultrasound image documentation was performed. A guidewire was advanced. A 4 French micropuncture dilator was advanced into the vein and secured for use as intravenous access. Ultrasound was used to confirm patency of the right common femoral artery.  Local anesthesia was provided with 1% lidocaine. Under ultrasound guidance, a 21 gauge needle was advanced into the right common femoral artery. Ultrasound image documentation was performed. After placement of a micropuncture dilator, a 5 French sheath was then placed over a guidewire. A 5 French Cobra catheter was advanced into the abdominal aorta and used to selectively catheterize the superior mesenteric artery. Selective arteriography of the superior mesenteric artery was performed in different projections. A microcatheter was then advanced through the 5 French catheter and into the trunk of the superior mesenteric artery. A third order branch vessel was catheterized and selective arteriography performed. A separate second order SMA branch was then catheterized with a microcatheter followed by further catheterization of a third order branch. Selective arteriography was performed. The microcatheter was then used to further catheterize a fourth order branch. Selective arteriography was performed. Transcatheter embolization was performed with deployment of a single 1 mm x 2 cm Kristyana LP coil. Additional follow-up arteriography was performed. After catheter removal and sheath removal, hemostasis was obtained with use of the Cordis ExoSeal device. FINDINGS: Intravenous access was successfully stab list at the level of the right basilic vein in the upper arm. This access was utilized for conscious sedation during the procedure and was also kept in place for use during hospital admission. Initial selective arteriography of the superior mesenteric artery demonstrated normal patency of branch vessels without evidence of active bleeding, pseudoaneurysm, vascular malformation or other vascular abnormality. On the third selective SMA arteriogram, there was evidence active contrast extravasation at the level of the mid to distal transverse colon in the region of bleeding identified by CT angiography. 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 After mapping out the probable route of supply to the transverse colon, a tortuous proximal branch was eventually able to be catheterized that supplies the transverse colon. Initial selective arteriography at the level of this branch did not re-demonstrate active bleeding. However, with further advancement of the catheter into the origin of a fourth order descending transverse colonic branch, active extravasation of contrast material was  again demonstrated. The microcatheter could not be advanced beyond the origin of this branch vessel due to tortuosity and small vessel caliber as well as probable component of spasm. Therefore, coil embolization was performed at the base of this vessel resulting in occlusion as well as some additional branch vessels distal to the coil. Follow-up arteriography did not demonstrate active extravasation at the level of previously demonstrated bleeding. IMPRESSION: 1. Additional peripheral intravenous access established under ultrasound guidance for use for IV conscious sedation during the procedure. 2. Arteriography of the superior mesenteric artery as well as multiple distal branch vessel supply demonstrates active contrast extravasation from a fourth order descending branch supplying the transverse colon at the level of active bleeding identified by CT angiography. The origin of this branch vessel was able to be successfully catheterized with deployment of a single embolization coil resulting in branch vessel occlusion and lack of contrast extravasation following embolization. Electronically Signed   By: Irish Lack M.D.   On: 08/29/2021 08:36   IR US Guide Vasc Access Right  Result Date: 08/29/2021 INDICATION: Lower GI bleed and hypotension with positive CT angiography demonstrating active extravasation of contrast  from a branch supplying the distal transverse colon. The patient has a single current functioning IV through which she is receiving blood. There has been inability to establish a second peripheral IV. EXAM: 1. ULTRASOUND-GUIDED PERIPHERAL INTRAVENOUS ACCESS 2. ULTRASOUND GUIDANCE FOR VASCULAR ACCESS OF THE RIGHT COMMON FEMORAL ARTERY 3. SELECTIVE ARTERIOGRAPHY OF THE SUPERIOR MESENTERIC ARTERY 4. ADDITIONAL SELECTIVE ARTERIOGRAPHY OF A THIRD ORDER SMA BRANCH 5. ADDITIONAL SELECTIVE ARTERIOGRAPHY A THIRD ORDER SMA BRANCH 6. ADDITIONAL SELECTIVE ARTERIOGRAPHY A FOURTH ORDER SMA BRANCH 7. TRANSCATHETER EMBOLIZATION OF SMA BRANCH 8. FOLLOW-UP ARTERIOGRAPHY AFTER EMBOLIZATION MEDICATIONS: None ANESTHESIA/SEDATION: Moderate (conscious) sedation was employed during this procedure. A total of Versed 3.0 mg and Fentanyl 100 mcg was administered intravenously. Moderate Sedation Time: 99 minutes. The patient's level of consciousness and vital signs were monitored continuously by radiology nursing throughout the procedure under my direct supervision. CONTRAST:  75 mL Omnipaque 350 FLUOROSCOPY TIME:  Fluoroscopy Time: 33 minutes.  716 mGy COMPLICATIONS: None immediate. PROCEDURE: Informed consent was obtained from the patient's husband following explanation of the procedure, risks, benefits and alternatives. The patient and her husband understand, agree and consent for the procedure. All questions were addressed. The husband signed consent due to patient history of history of mild dementia. A time out was performed prior to the initiation of the procedure. Maximal barrier sterile technique utilized including caps, mask, sterile gowns, sterile gloves, large sterile drape, hand hygiene, and chlorhexidine prep. Ultrasound was used to confirm patency of the right basilic vein in the upper arm. Local anesthesia was provided with 1% lidocaine. Under ultrasound guidance, a 21 gauge needle was utilized to access the vein. Ultrasound  image documentation was performed. A guidewire was advanced. A 4 French micropuncture dilator was advanced into the vein and secured for use as intravenous access. Ultrasound was used to confirm patency of the right common femoral artery. Local anesthesia was provided with 1% lidocaine. Under ultrasound guidance, a 21 gauge needle was advanced into the right common femoral artery. Ultrasound image documentation was performed. After placement of a micropuncture dilator, a 5 French sheath was then placed over a guidewire. A 5 French Cobra catheter was advanced into the abdominal aorta and used to selectively catheterize the superior mesenteric artery. Selective arteriography of the superior mesenteric artery was performed in different projections. A microcatheter was then advanced through the  5 French catheter and into the trunk of the superior mesenteric artery. A third order branch vessel was catheterized and selective arteriography performed. A separate second order SMA branch was then catheterized with a microcatheter followed by further catheterization of a third order branch. Selective arteriography was performed. The microcatheter was then used to further catheterize a fourth order branch. Selective arteriography was performed. Transcatheter embolization was performed with deployment of a single 1 mm x 2 cm Cambree LP coil. Additional follow-up arteriography was performed. After catheter removal and sheath removal, hemostasis was obtained with use of the Cordis ExoSeal device. FINDINGS: Intravenous access was successfully stab list at the level of the right basilic vein in the upper arm. This access was utilized for conscious sedation during the procedure and was also kept in place for use during hospital admission. Initial selective arteriography of the superior mesenteric artery demonstrated normal patency of branch vessels without evidence of active bleeding, pseudoaneurysm, vascular malformation or other  vascular abnormality. On the third selective SMA arteriogram, there was evidence active contrast extravasation at the level of the mid to distal transverse colon in the region of bleeding identified by CT angiography. 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 After mapping out the probable route of supply to the transverse colon, a tortuous proximal branch was eventually able to be catheterized that supplies the transverse colon. Initial selective arteriography at the level of this branch did not re-demonstrate active bleeding. However, with further advancement of the catheter into the origin of a fourth order descending transverse colonic branch, active extravasation of contrast material was again demonstrated. The microcatheter could not be advanced beyond the origin of this branch vessel due to tortuosity and small vessel caliber as well as probable component of spasm. Therefore, coil embolization was performed at the base of this vessel resulting in occlusion as well as some additional branch vessels distal to the coil. Follow-up arteriography did not demonstrate active extravasation at the level of previously demonstrated bleeding. IMPRESSION: 1. Additional peripheral intravenous access established under ultrasound guidance for use for IV conscious sedation during the procedure. 2. Arteriography of the superior mesenteric artery as well as multiple distal branch vessel supply demonstrates active contrast extravasation from a fourth order descending branch supplying the transverse colon at the level of active bleeding identified by CT angiography. The origin of this branch vessel was able to be successfully catheterized with deployment of a single embolization coil resulting in branch vessel occlusion and lack of contrast extravasation following embolization.  Electronically Signed   By: Irish Lack M.D.   On: 08/29/2021 08:36   IR US Guide Vasc Access Right  Result Date: 08/29/2021 INDICATION: Lower GI bleed and hypotension with positive CT angiography demonstrating active extravasation of contrast from a branch supplying the distal transverse colon. The patient has a single current functioning IV through which she is receiving blood. There has been inability to establish a second peripheral IV. EXAM: 1. ULTRASOUND-GUIDED PERIPHERAL INTRAVENOUS ACCESS 2. ULTRASOUND GUIDANCE FOR VASCULAR ACCESS OF THE RIGHT COMMON FEMORAL ARTERY 3. SELECTIVE ARTERIOGRAPHY OF THE SUPERIOR MESENTERIC ARTERY 4. ADDITIONAL SELECTIVE ARTERIOGRAPHY OF A THIRD ORDER SMA BRANCH 5. ADDITIONAL SELECTIVE ARTERIOGRAPHY A THIRD ORDER SMA BRANCH 6. ADDITIONAL SELECTIVE ARTERIOGRAPHY A FOURTH ORDER SMA BRANCH 7. TRANSCATHETER EMBOLIZATION OF SMA BRANCH 8. FOLLOW-UP ARTERIOGRAPHY AFTER EMBOLIZATION MEDICATIONS: None ANESTHESIA/SEDATION: Moderate (conscious) sedation was employed during this  procedure. A total of Versed 3.0 mg and Fentanyl 100 mcg was administered intravenously. Moderate Sedation Time: 99 minutes. The patient's level of consciousness and vital signs were monitored continuously by radiology nursing throughout the procedure under my direct supervision. CONTRAST:  75 mL Omnipaque 350 FLUOROSCOPY TIME:  Fluoroscopy Time: 33 minutes.  716 mGy COMPLICATIONS: None immediate. PROCEDURE: Informed consent was obtained from the patient's husband following explanation of the procedure, risks, benefits and alternatives. The patient and her husband understand, agree and consent for the procedure. All questions were addressed. The husband signed consent due to patient history of history of mild dementia. A time out was performed prior to the initiation of the procedure. Maximal barrier sterile technique utilized including caps, mask, sterile gowns, sterile gloves, large sterile drape, hand  hygiene, and chlorhexidine prep. Ultrasound was used to confirm patency of the right basilic vein in the upper arm. Local anesthesia was provided with 1% lidocaine. Under ultrasound guidance, a 21 gauge needle was utilized to access the vein. Ultrasound image documentation was performed. A guidewire was advanced. A 4 French micropuncture dilator was advanced into the vein and secured for use as intravenous access. Ultrasound was used to confirm patency of the right common femoral artery. Local anesthesia was provided with 1% lidocaine. Under ultrasound guidance, a 21 gauge needle was advanced into the right common femoral artery. Ultrasound image documentation was performed. After placement of a micropuncture dilator, a 5 French sheath was then placed over a guidewire. A 5 French Cobra catheter was advanced into the abdominal aorta and used to selectively catheterize the superior mesenteric artery. Selective arteriography of the superior mesenteric artery was performed in different projections. A microcatheter was then advanced through the 5 French catheter and into the trunk of the superior mesenteric artery. A third order branch vessel was catheterized and selective arteriography performed. A separate second order SMA branch was then catheterized with a microcatheter followed by further catheterization of a third order branch. Selective arteriography was performed. The microcatheter was then used to further catheterize a fourth order branch. Selective arteriography was performed. Transcatheter embolization was performed with deployment of a single 1 mm x 2 cm Noam LP coil. Additional follow-up arteriography was performed. After catheter removal and sheath removal, hemostasis was obtained with use of the Cordis ExoSeal device. FINDINGS: Intravenous access was successfully stab list at the level of the right basilic vein in the upper arm. This access was utilized for conscious sedation during the procedure and was  also kept in place for use during hospital admission. Initial selective arteriography of the superior mesenteric artery demonstrated normal patency of branch vessels without evidence of active bleeding, pseudoaneurysm, vascular malformation or other vascular abnormality. On the third selective SMA arteriogram, there was evidence active contrast extravasation at the level of the mid to distal transverse colon in the region of bleeding identified by CT angiography. 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 After mapping out the probable route of supply to the transverse colon, a tortuous proximal branch was eventually able to be catheterized that supplies the transverse colon. Initial selective arteriography at the level of this branch did not re-demonstrate active bleeding. However, with further advancement of the catheter into the origin of a fourth order descending transverse colonic branch, active extravasation of contrast material was again demonstrated. The microcatheter could not be advanced beyond the origin of  this branch vessel due to tortuosity and small vessel caliber as well as probable component of spasm. Therefore, coil embolization was performed at the base of this vessel resulting in occlusion as well as some additional branch vessels distal to the coil. Follow-up arteriography did not demonstrate active extravasation at the level of previously demonstrated bleeding. IMPRESSION: 1. Additional peripheral intravenous access established under ultrasound guidance for use for IV conscious sedation during the procedure. 2. Arteriography of the superior mesenteric artery as well as multiple distal branch vessel supply demonstrates active contrast extravasation from a fourth order descending branch supplying the transverse colon at the level of active bleeding  identified by CT angiography. The origin of this branch vessel was able to be successfully catheterized with deployment of a single embolization coil resulting in branch vessel occlusion and lack of contrast extravasation following embolization. Electronically Signed   By: Irish LackGlenn  Yamagata M.D.   On: 08/29/2021 08:36   DG CHEST PORT 1 VIEW  Result Date: 08/24/2021 CLINICAL DATA:  GI bleed.  Dizziness. EXAM: PORTABLE CHEST 1 VIEW COMPARISON:  02/05/2016 FINDINGS: The cardiomediastinal contours are normal. Minimal elevation of right hemidiaphragm which is similar to prior. Pulmonary vasculature is normal. No consolidation, pleural effusion, or pneumothorax. No acute osseous abnormalities are seen. IMPRESSION: No acute chest findings. Electronically Signed   By: Narda RutherfordMelanie  Sanford M.D.   On: 08/24/2021 16:48   IR RADIOLOGY PERIPHERAL GUIDED IV START  Result Date: 08/29/2021 INDICATION: Lower GI bleed and hypotension with positive CT angiography demonstrating active extravasation of contrast from a branch supplying the distal transverse colon. The patient has a single current functioning IV through which she is receiving blood. There has been inability to establish a second peripheral IV. EXAM: 1. ULTRASOUND-GUIDED PERIPHERAL INTRAVENOUS ACCESS 2. ULTRASOUND GUIDANCE FOR VASCULAR ACCESS OF THE RIGHT COMMON FEMORAL ARTERY 3. SELECTIVE ARTERIOGRAPHY OF THE SUPERIOR MESENTERIC ARTERY 4. ADDITIONAL SELECTIVE ARTERIOGRAPHY OF A THIRD ORDER SMA BRANCH 5. ADDITIONAL SELECTIVE ARTERIOGRAPHY A THIRD ORDER SMA BRANCH 6. ADDITIONAL SELECTIVE ARTERIOGRAPHY A FOURTH ORDER SMA BRANCH 7. TRANSCATHETER EMBOLIZATION OF SMA BRANCH 8. FOLLOW-UP ARTERIOGRAPHY AFTER EMBOLIZATION MEDICATIONS: None ANESTHESIA/SEDATION: Moderate (conscious) sedation was employed during this procedure. A total of Versed 3.0 mg and Fentanyl 100 mcg was administered intravenously. Moderate Sedation Time: 99 minutes. The patient's level of consciousness and  vital signs were monitored continuously by radiology nursing throughout the procedure under my direct supervision. CONTRAST:  75 mL Omnipaque 350 FLUOROSCOPY TIME:  Fluoroscopy Time: 33 minutes.  716 mGy COMPLICATIONS: None immediate. PROCEDURE: Informed consent was obtained from the patient's husband following explanation of the procedure, risks, benefits and alternatives. The patient and her husband understand, agree and consent for the procedure. All questions were addressed. The husband signed consent due to patient history of history of mild dementia. A time out was performed prior to the initiation of the procedure. Maximal barrier sterile technique utilized including caps, mask, sterile gowns, sterile gloves, large sterile drape, hand hygiene, and chlorhexidine prep. Ultrasound was used to confirm patency of the right basilic vein in the upper arm. Local anesthesia was provided with 1% lidocaine. Under ultrasound guidance, a 21 gauge needle was utilized to access the vein. Ultrasound image documentation was performed. A guidewire was advanced. A 4 French micropuncture dilator was advanced into the vein and secured for use as intravenous access. Ultrasound was used to confirm patency of the right common femoral artery. Local anesthesia was provided with 1% lidocaine. Under ultrasound guidance, a 21 gauge needle was advanced into  the right common femoral artery. Ultrasound image documentation was performed. After placement of a micropuncture dilator, a 5 French sheath was then placed over a guidewire. A 5 French Cobra catheter was advanced into the abdominal aorta and used to selectively catheterize the superior mesenteric artery. Selective arteriography of the superior mesenteric artery was performed in different projections. A microcatheter was then advanced through the 5 French catheter and into the trunk of the superior mesenteric artery. A third order branch vessel was catheterized and selective  arteriography performed. A separate second order SMA branch was then catheterized with a microcatheter followed by further catheterization of a third order branch. Selective arteriography was performed. The microcatheter was then used to further catheterize a fourth order branch. Selective arteriography was performed. Transcatheter embolization was performed with deployment of a single 1 mm x 2 cm Theodosia LP coil. Additional follow-up arteriography was performed. After catheter removal and sheath removal, hemostasis was obtained with use of the Cordis ExoSeal device. FINDINGS: Intravenous access was successfully stab list at the level of the right basilic vein in the upper arm. This access was utilized for conscious sedation during the procedure and was also kept in place for use during hospital admission. Initial selective arteriography of the superior mesenteric artery demonstrated normal patency of branch vessels without evidence of active bleeding, pseudoaneurysm, vascular malformation or other vascular abnormality. On the third selective SMA arteriogram, there was evidence active contrast extravasation at the level of the mid to distal transverse colon in the region of bleeding identified by CT angiography. 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 After mapping out the probable route of supply to the transverse colon, a tortuous proximal branch was eventually able to be catheterized that supplies the transverse colon. Initial selective arteriography at the level of this branch did not re-demonstrate active bleeding. However, with further advancement of the catheter into the origin of a fourth order descending transverse colonic branch, active extravasation of contrast material was again demonstrated. The microcatheter could not be advanced beyond the origin of this  branch vessel due to tortuosity and small vessel caliber as well as probable component of spasm. Therefore, coil embolization was performed at the base of this vessel resulting in occlusion as well as some additional branch vessels distal to the coil. Follow-up arteriography did not demonstrate active extravasation at the level of previously demonstrated bleeding. IMPRESSION: 1. Additional peripheral intravenous access established under ultrasound guidance for use for IV conscious sedation during the procedure. 2. Arteriography of the superior mesenteric artery as well as multiple distal branch vessel supply demonstrates active contrast extravasation from a fourth order descending branch supplying the transverse colon at the level of active bleeding identified by CT angiography. The origin of this branch vessel was able to be successfully catheterized with deployment of a single embolization coil resulting in branch vessel occlusion and lack of contrast extravasation following embolization. Electronically Signed   By: Irish Lack M.D.   On: 08/29/2021 08:36   IR EMBO ART  VEN HEMORR LYMPH EXTRAV  INC GUIDE ROADMAPPING  Result Date: 08/29/2021 INDICATION: Lower GI bleed and hypotension with positive CT angiography demonstrating active extravasation of contrast from a branch supplying the distal transverse colon. The patient has a single current functioning IV through which she is receiving blood. There has been inability to establish a second peripheral IV. EXAM: 1. ULTRASOUND-GUIDED PERIPHERAL  INTRAVENOUS ACCESS 2. ULTRASOUND GUIDANCE FOR VASCULAR ACCESS OF THE RIGHT COMMON FEMORAL ARTERY 3. SELECTIVE ARTERIOGRAPHY OF THE SUPERIOR MESENTERIC ARTERY 4. ADDITIONAL SELECTIVE ARTERIOGRAPHY OF A THIRD ORDER SMA BRANCH 5. ADDITIONAL SELECTIVE ARTERIOGRAPHY A THIRD ORDER SMA BRANCH 6. ADDITIONAL SELECTIVE ARTERIOGRAPHY A FOURTH ORDER SMA BRANCH 7. TRANSCATHETER EMBOLIZATION OF SMA BRANCH 8. FOLLOW-UP ARTERIOGRAPHY  AFTER EMBOLIZATION MEDICATIONS: None ANESTHESIA/SEDATION: Moderate (conscious) sedation was employed during this procedure. A total of Versed 3.0 mg and Fentanyl 100 mcg was administered intravenously. Moderate Sedation Time: 99 minutes. The patient's level of consciousness and vital signs were monitored continuously by radiology nursing throughout the procedure under my direct supervision. CONTRAST:  75 mL Omnipaque 350 FLUOROSCOPY TIME:  Fluoroscopy Time: 33 minutes.  716 mGy COMPLICATIONS: None immediate. PROCEDURE: Informed consent was obtained from the patient's husband following explanation of the procedure, risks, benefits and alternatives. The patient and her husband understand, agree and consent for the procedure. All questions were addressed. The husband signed consent due to patient history of history of mild dementia. A time out was performed prior to the initiation of the procedure. Maximal barrier sterile technique utilized including caps, mask, sterile gowns, sterile gloves, large sterile drape, hand hygiene, and chlorhexidine prep. Ultrasound was used to confirm patency of the right basilic vein in the upper arm. Local anesthesia was provided with 1% lidocaine. Under ultrasound guidance, a 21 gauge needle was utilized to access the vein. Ultrasound image documentation was performed. A guidewire was advanced. A 4 French micropuncture dilator was advanced into the vein and secured for use as intravenous access. Ultrasound was used to confirm patency of the right common femoral artery. Local anesthesia was provided with 1% lidocaine. Under ultrasound guidance, a 21 gauge needle was advanced into the right common femoral artery. Ultrasound image documentation was performed. After placement of a micropuncture dilator, a 5 French sheath was then placed over a guidewire. A 5 French Cobra catheter was advanced into the abdominal aorta and used to selectively catheterize the superior mesenteric artery.  Selective arteriography of the superior mesenteric artery was performed in different projections. A microcatheter was then advanced through the 5 French catheter and into the trunk of the superior mesenteric artery. A third order branch vessel was catheterized and selective arteriography performed. A separate second order SMA branch was then catheterized with a microcatheter followed by further catheterization of a third order branch. Selective arteriography was performed. The microcatheter was then used to further catheterize a fourth order branch. Selective arteriography was performed. Transcatheter embolization was performed with deployment of a single 1 mm x 2 cm Sharlette LP coil. Additional follow-up arteriography was performed. After catheter removal and sheath removal, hemostasis was obtained with use of the Cordis ExoSeal device. FINDINGS: Intravenous access was successfully stab list at the level of the right basilic vein in the upper arm. This access was utilized for conscious sedation during the procedure and was also kept in place for use during hospital admission. Initial selective arteriography of the superior mesenteric artery demonstrated normal patency of branch vessels without evidence of active bleeding, pseudoaneurysm, vascular malformation or other vascular abnormality. On the third selective SMA arteriogram, there was evidence active contrast extravasation at the level of the mid to distal transverse colon in the region of bleeding identified by CT angiography. 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 After mapping out the probable route  of supply to the transverse colon, a tortuous proximal branch was eventually able to be catheterized that supplies the transverse colon. Initial selective arteriography at the level of this branch did not  re-demonstrate active bleeding. However, with further advancement of the catheter into the origin of a fourth order descending transverse colonic branch, active extravasation of contrast material was again demonstrated. The microcatheter could not be advanced beyond the origin of this branch vessel due to tortuosity and small vessel caliber as well as probable component of spasm. Therefore, coil embolization was performed at the base of this vessel resulting in occlusion as well as some additional branch vessels distal to the coil. Follow-up arteriography did not demonstrate active extravasation at the level of previously demonstrated bleeding. IMPRESSION: 1. Additional peripheral intravenous access established under ultrasound guidance for use for IV conscious sedation during the procedure. 2. Arteriography of the superior mesenteric artery as well as multiple distal branch vessel supply demonstrates active contrast extravasation from a fourth order descending branch supplying the transverse colon at the level of active bleeding identified by CT angiography. The origin of this branch vessel was able to be successfully catheterized with deployment of a single embolization coil resulting in branch vessel occlusion and lack of contrast extravasation following embolization. Electronically Signed   By: Irish Lack M.D.   On: 08/29/2021 08:36   CT ANGIO GI BLEED  Result Date: 08/27/2021 CLINICAL DATA:  GI bleed. EXAM: CTA ABDOMEN AND PELVIS WITHOUT AND WITH CONTRAST TECHNIQUE: Multidetector CT imaging of the abdomen and pelvis was performed using the standard protocol during bolus administration of intravenous contrast. Multiplanar reconstructed images and MIPs were obtained and reviewed to evaluate the vascular anatomy. CONTRAST:  OMNIPAQUE IOHEXOL 350 MG/ML SOLN COMPARISON:  Abdominal CTA 2 days ago 08/25/2021 FINDINGS: VASCULAR Aorta: Mild atherosclerosis. No aneurysm, dissection, or significant stenosis.  No periaortic stranding or inflammation. Celiac: The right hepatic artery has a separate origin arising from the abdominal aorta. Celiac artery is patent. Distal branch vessels are patent. SMA: Patent without evidence of aneurysm, dissection, vasculitis or significant stenosis. Renals: Mild calcified plaque in the origin of the renal arteries without significant stenosis. No acute findings or change from prior. Single bilateral renal arteries. IMA: Patent. Inflow: Patent without evidence of aneurysm, dissection, vasculitis or significant stenosis. Iliac tortuosity again seen. Proximal Outflow: Bilateral common femoral and visualized portions of the superficial and profunda femoral arteries are patent without evidence of aneurysm, dissection, vasculitis or significant stenosis. Veins: No obvious venous abnormality within the limitations of this arterial phase study. Review of the MIP images confirms the above findings. NON-VASCULAR Lower chest: Lung bases are clear. No acute airspace disease or pleural effusion. Hepatobiliary: No focal hepatic abnormality. There is vicarious excretion of IV contrast in the gallbladder accounting for hyperdense appearance on the current exam. No biliary dilatation or pericholecystic inflammation. Pancreas: No ductal dilatation or inflammation. Spleen: Normal in size without focal abnormality. Adrenals/Urinary Tract: No adrenal nodule. Homogeneous renal enhancement without hydronephrosis. Again seen right renal cysts. No suspicious renal lesion. Unremarkable urinary bladder. Stomach/Bowel: Examination is positive for active extravasation of contrast into the GI tract. There is extravasation of contrast into the mid transverse colon, series 10, image 94, that increases on venous phase. No additional sites of IV contrast extravasation. Diffuse colonic diverticulosis without diverticulitis. Normal appendix. Lymphatic: No abdominopelvic adenopathy. Reproductive: Partial hysterectomy with  prominence of the vaginal cuff unchanged. No adnexal mass. Other: No ascites or free air. Small fat containing umbilical hernia.  Small bilateral fat containing inguinal hernias. Musculoskeletal: There are no acute or suspicious osseous abnormalities. Transitional lumbosacral anatomy with enlarged transverse processes and pseudoarticulation of the sacrum with L5. Mild bilateral hip osteoarthritis. IMPRESSION: VASCULAR 1. Examination is positive for active GI bleed in the mid distal transverse colon. 2. Mild aortic atherosclerosis. NON-VASCULAR 1. Colonic diverticulosis without focal diverticulitis. 2. Small fat containing bilateral inguinal and umbilical hernias. Electronically Signed   By: Narda Rutherford M.D.   On: 08/27/2021 21:31   CT Angio Abd/Pel w/ and/or w/o  Result Date: 08/25/2021 CLINICAL DATA:  GI bleed with passage of dark red bowel movements over the last several days. EXAM: CT ANGIOGRAPHY ABDOMEN AND PELVIS WITH CONTRAST AND WITHOUT CONTRAST TECHNIQUE: Multidetector CT imaging of the abdomen and pelvis was performed using the standard protocol during bolus administration of intravenous contrast. Multiplanar reconstructed images and MIPs were obtained and reviewed to evaluate the vascular anatomy. CONTRAST:  OMNIPAQUE IOHEXOL 350 MG/ML SOLN COMPARISON:  None. FINDINGS: VASCULAR Aorta: Minimal plaque in the abdominal aorta without evidence aneurysm, dissection or stenosis. Celiac: Separate origin the common hepatic artery off of the abdominal aorta. Adjacent trunk supplies the splenic artery and left gastric artery. Branch vessels are normally patent. No active bleed or pseudoaneurysm in the celiac distribution. SMA: Normally patent. Visualized branch vessels are normally patent. No active bleed or pseudoaneurysm in the SMA distribution. Renals: Mild calcified plaque of proximal bilateral single renal arteries without evidence of significant stenosis. IMA: Normally patent. No active bleed or  pseudoaneurysm in the IMA distribution. Inflow: Bilateral iliac arteries demonstrate mild tortuosity without evidence of stenosis or aneurysm. Proximal Outflow: Normally patent bilateral common femoral arteries and femoral bifurcations. Veins: Venous phase imaging demonstrates normal patency of visualized venous structures including mesenteric veins, splenic vein, portal vein, bilateral renal veins, IVC, iliac veins and common femoral veins. There is no evidence of extravasated intravascular contrast accumulation focally in the gastrointestinal tract on the venous phase of imaging. Review of the MIP images confirms the above findings. NON-VASCULAR Lower chest: No acute abnormality. Hepatobiliary: No focal liver abnormality is seen. No gallstones, gallbladder wall thickening, or biliary dilatation. Pancreas: Unremarkable. No pancreatic ductal dilatation or surrounding inflammatory changes. Spleen: No splenic injury or perisplenic hematoma. Normal spleen size. Adrenals/Urinary Tract: Adrenal glands are unremarkable. Kidneys are normal, without renal calculi, focal lesion, or hydronephrosis. Benign-appearing adjacent simple cysts of the right kidney. Bladder is unremarkable. Stomach/Bowel: Bowel shows no evidence of obstruction, ileus or inflammation. No free intraperitoneal air identified. There is diffuse diverticulosis involving the entire colon without evidence acute diverticulitis or focal abscess. The appendix is normal. Lymphatic: No enlarged abdominal or pelvic lymph nodes. Reproductive: Status post hysterectomy. No adnexal masses. Other: No ascites. Small inguinal hernias are present bilaterally containing fat. Musculoskeletal: No acute or significant osseous findings. IMPRESSION: 1. No evidence of active bleeding into the gastrointestinal tract on arterial or venous phases of imaging by CTA. 2. No significant vascular abnormalities. No arterial pseudoaneurysms identified. No evidence of mesenteric arterial  occlusive disease. Normal variant separate origin of the common hepatic artery off of the abdominal aorta. 3. Diffuse diverticulosis of the entire colon without evidence of diverticulitis or other inflammatory process. 4. Small bilateral inguinal hernias containing fat. Electronically Signed   By: Irish Lack M.D.   On: 08/25/2021 09:26    Microbiology: Recent Results (from the past 240 hour(s))  Resp Panel by RT-PCR (Flu A&B, Covid) Nasopharyngeal Swab     Status: None   Collection Time:  08/24/21  1:10 PM   Specimen: Nasopharyngeal Swab; Nasopharyngeal(NP) swabs in vial transport medium  Result Value Ref Range Status   SARS Coronavirus 2 by RT PCR NEGATIVE NEGATIVE Final    Comment: (NOTE) SARS-CoV-2 target nucleic acids are NOT DETECTED.  The SARS-CoV-2 RNA is generally detectable in upper respiratory specimens during the acute phase of infection. The lowest concentration of SARS-CoV-2 viral copies this assay can detect is 138 copies/mL. A negative result does not preclude SARS-Cov-2 infection and should not be used as the sole basis for treatment or other patient management decisions. A negative result may occur with  improper specimen collection/handling, submission of specimen other than nasopharyngeal swab, presence of viral mutation(s) within the areas targeted by this assay, and inadequate number of viral copies(<138 copies/mL). A negative result must be combined with clinical observations, patient history, and epidemiological information. The expected result is Negative.  Fact Sheet for Patients:  BloggerCourse.com  Fact Sheet for Healthcare Providers:  SeriousBroker.it  This test is no t yet approved or cleared by the [place] FDA and  has been authorized for detection and/or diagnosis of SARS-CoV-2 by FDA under an Emergency Use Authorization (EUA). This EUA will remain  in effect (meaning this test can be used) for  the duration of the COVID-19 declaration under Section 564(b)(1) of the Act, 21 U.S.C.section 360bbb-3(b)(1), unless the authorization is terminated  or revoked sooner.       Influenza A by PCR NEGATIVE NEGATIVE Final   Influenza B by PCR NEGATIVE NEGATIVE Final    Comment: (NOTE) The Xpert Xpress SARS-CoV-2/FLU/RSV plus assay is intended as an aid in the diagnosis of influenza from Nasopharyngeal swab specimens and should not be used as a sole basis for treatment. Nasal washings and aspirates are unacceptable for Xpert Xpress SARS-CoV-2/FLU/RSV testing.  Fact Sheet for Patients: BloggerCourse.com  Fact Sheet for Healthcare Providers: SeriousBroker.it  This test is not yet approved or cleared by the [place] FDA and has been authorized for detection and/or diagnosis of SARS-CoV-2 by FDA under an Emergency Use Authorization (EUA). This EUA will remain in effect (meaning this test can be used) for the duration of the COVID-19 declaration under Section 564(b)(1) of the Act, 21 U.S.C. section 360bbb-3(b)(1), unless the authorization is terminated or revoked.  Performed at [facility], [address] [place], [place] [address]      Labs: Basic Metabolic Panel: Recent Labs  Lab 08/25/21 0228 08/25/21 1844 08/26/21 0647 08/28/21 1450 08/29/21 0328  NA 139  --  137 139 138  K 4.2  --  3.5 3.9 3.6  CL 107  --  108 109 106  CO2 24  --  23 25 24   GLUCOSE 101*  --  114* 145* 125*  BUN 12 8 5* 11 13  CREATININE 0.53  --  0.65 0.80 0.73  CALCIUM 9.1  --  8.6* 8.3* 8.4*   Liver Function Tests: Recent Labs  Lab 08/26/21 0647  AST 18  ALT 10  ALKPHOS 30*  BILITOT 0.1*  PROT 5.6*  ALBUMIN 2.8*   No results for input(s): LIPASE, AMYLASE in the last 168 hours. No results for input(s): AMMONIA in the last 168 hours. CBC: Recent Labs  Lab 08/25/21 0228 08/25/21 1125 08/26/21 0731 08/26/21 1311  08/28/21 0549 08/28/21 1450 08/29/21 0328 08/29/21 2058 08/31/21 0915  WBC 9.9  --  8.1  --   --  17.1* 14.3*  --   --   HGB 8.4*   < >  7.2*   < > 8.5* 7.7* 6.3* 9.2* 8.4*  HCT 26.1*   < > 22.4*   < > 25.8* 23.3* 19.3* 27.8* 25.9*  MCV 81.8  --  82.7  --   --  82.6 82.8  --   --   PLT 211  --  207  --   --  134* 135*  --   --    < > = values in this interval not displayed.   Cardiac Enzymes: No results for input(s): CKTOTAL, CKMB, CKMBINDEX, TROPONINI in the last 168 hours. BNP: BNP (last 3 results) No results for input(s): BNP in the last 8760 hours.  ProBNP (last 3 results) No results for input(s): PROBNP in the last 8760 hours.  CBG: Recent Labs  Lab 08/26/21 1808  GLUCAP 138*       Signed:  Darlin Drop, MD [facility] 08/31/2021, 6:16 PM

## 2021-08-31 NOTE — Plan of Care (Signed)

## 2021-08-31 NOTE — Progress Notes (Signed)
Referring Physician(s): Hazeline Junker  Supervising Physician: Gilmer Mor  Patient Status:  [facility] - In-pt  Chief Complaint: Follow up GI bleed embolization 08/28/21 in IR  Subjective:  Patient laying in bed, she denies any complaints and tells me she feels well. Her husband is out getting her some food which she is looking forward to. She denies any abdominal pain, chest, n/v, dizziness, headache. She has not had a bowel movement yet today, however yesterday evening she had a bowel movement with dark maroon colored blood - she did not notice any bright red blood in her stool and has not had any rectal bleeding.  Allergies: Ciprofloxacin  Medications: Prior to Admission medications   Medication Sig Start Date End Date Taking? Authorizing Provider  acetaminophen (TYLENOL) 500 MG tablet Take 500-1,000 mg by mouth every 8 (eight) hours as needed for moderate pain, headache or mild pain.   Yes [provider]  benzonatate (TESSALON) 100 MG capsule Use 1 every 4 hour as needed for cough Patient not taking: Reported on 08/24/2021 11/25/12   Storm Frisk, MD  NONFORMULARY OR COMPOUNDED ITEM Antiinflammatory Cream: Diclofenac 3%, Baclofen 2%, Cyclobenzaprine 2%, Lidocaine 2% dispense 120gram, apply 1-2 grams to affected area 3-4 times dailt Patient not taking: Reported on 08/24/2021 09/29/15   Lenn Sink, DPM     Vital Signs: BP 128/86   Pulse 70   Temp 97.6 F (36.4 C) (Oral)   Resp 17   SpO2 97%   Physical Exam Vitals and nursing note reviewed.  Constitutional:      General: She is not in acute distress. HENT:     Head: Normocephalic.  Cardiovascular:     Rate and Rhythm: Normal rate.  Pulmonary:     Effort: Pulmonary effort is normal.     Breath sounds: Normal breath sounds.  Abdominal:     General: There is no distension.     Palpations: Abdomen is soft.     Tenderness: There is no abdominal tenderness.  Skin:    General: Skin is warm and dry.      Coloration: Skin is not pale.  Neurological:     Mental Status: She is alert and oriented to person, place, and time.  Psychiatric:        Mood and Affect: Mood normal.        Behavior: Behavior normal.        Thought Content: Thought content normal.        Judgment: Judgment normal.    Imaging: IR Angiogram Visceral Selective  Result Date: 08/29/2021 INDICATION: Lower GI bleed and hypotension with positive CT angiography demonstrating active extravasation of contrast from a branch supplying the distal transverse colon. The patient has a single current functioning IV through which she is receiving blood. There has been inability to establish a second peripheral IV. EXAM: 1. ULTRASOUND-GUIDED PERIPHERAL INTRAVENOUS ACCESS 2. ULTRASOUND GUIDANCE FOR VASCULAR ACCESS OF THE RIGHT COMMON FEMORAL ARTERY 3. SELECTIVE ARTERIOGRAPHY OF THE SUPERIOR MESENTERIC ARTERY 4. ADDITIONAL SELECTIVE ARTERIOGRAPHY OF A THIRD ORDER SMA BRANCH 5. ADDITIONAL SELECTIVE ARTERIOGRAPHY A THIRD ORDER SMA BRANCH 6. ADDITIONAL SELECTIVE ARTERIOGRAPHY A FOURTH ORDER SMA BRANCH 7. TRANSCATHETER EMBOLIZATION OF SMA BRANCH 8. FOLLOW-UP ARTERIOGRAPHY AFTER EMBOLIZATION MEDICATIONS: None ANESTHESIA/SEDATION: Moderate (conscious) sedation was employed during this procedure. A total of Versed 3.0 mg and Fentanyl 100 mcg was administered intravenously. Moderate Sedation Time: 99 minutes. The patient's level of consciousness and vital signs were monitored continuously by radiology nursing throughout the procedure  under my direct supervision. CONTRAST:  75 mL Omnipaque 350 FLUOROSCOPY TIME:  Fluoroscopy Time: 33 minutes.  716 mGy COMPLICATIONS: None immediate. PROCEDURE: Informed consent was obtained from the patient's husband following explanation of the procedure, risks, benefits and alternatives. The patient and her husband understand, agree and consent for the procedure. All questions were addressed. The husband signed consent due to  patient history of history of mild dementia. A time out was performed prior to the initiation of the procedure. Maximal barrier sterile technique utilized including caps, mask, sterile gowns, sterile gloves, large sterile drape, hand hygiene, and chlorhexidine prep. Ultrasound was used to confirm patency of the right basilic vein in the upper arm. Local anesthesia was provided with 1% lidocaine. Under ultrasound guidance, a 21 gauge needle was utilized to access the vein. Ultrasound image documentation was performed. A guidewire was advanced. A 4 French micropuncture dilator was advanced into the vein and secured for use as intravenous access. Ultrasound was used to confirm patency of the right common femoral artery. Local anesthesia was provided with 1% lidocaine. Under ultrasound guidance, a 21 gauge needle was advanced into the right common femoral artery. Ultrasound image documentation was performed. After placement of a micropuncture dilator, a 5 French sheath was then placed over a guidewire. A 5 French Cobra catheter was advanced into the abdominal aorta and used to selectively catheterize the superior mesenteric artery. Selective arteriography of the superior mesenteric artery was performed in different projections. A microcatheter was then advanced through the 5 French catheter and into the trunk of the superior mesenteric artery. A third order branch vessel was catheterized and selective arteriography performed. A separate second order SMA branch was then catheterized with a microcatheter followed by further catheterization of a third order branch. Selective arteriography was performed. The microcatheter was then used to further catheterize a fourth order branch. Selective arteriography was performed. Transcatheter embolization was performed with deployment of a single 1 mm x 2 cm Ginevra LP coil. Additional follow-up arteriography was performed. After catheter removal and sheath removal, hemostasis was  obtained with use of the Cordis ExoSeal device. FINDINGS: Intravenous access was successfully stab list at the level of the right basilic vein in the upper arm. This access was utilized for conscious sedation during the procedure and was also kept in place for use during hospital admission. Initial selective arteriography of the superior mesenteric artery demonstrated normal patency of branch vessels without evidence of active bleeding, pseudoaneurysm, vascular malformation or other vascular abnormality. On the third selective SMA arteriogram, there was evidence active contrast extravasation at the level of the mid to distal transverse colon in the region of bleeding identified by CT angiography. 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 After mapping out the probable route of supply to the transverse colon, a tortuous proximal branch was eventually able to be catheterized that supplies the transverse colon. Initial selective arteriography at the level of this branch did not re-demonstrate active bleeding. However, with further advancement of the catheter into the origin of a fourth order descending transverse colonic branch, active extravasation of contrast material was again demonstrated. The microcatheter could not be advanced beyond the origin of this branch vessel due to tortuosity and small vessel caliber as well as probable component of spasm. Therefore, coil embolization was performed at the base of this vessel resulting in occlusion as well as some additional  branch vessels distal to the coil. Follow-up arteriography did not demonstrate active extravasation at the level of previously demonstrated bleeding. IMPRESSION: 1. Additional peripheral intravenous access established under ultrasound guidance for use for IV conscious sedation during the procedure. 2.  Arteriography of the superior mesenteric artery as well as multiple distal branch vessel supply demonstrates active contrast extravasation from a fourth order descending branch supplying the transverse colon at the level of active bleeding identified by CT angiography. The origin of this branch vessel was able to be successfully catheterized with deployment of a single embolization coil resulting in branch vessel occlusion and lack of contrast extravasation following embolization. Electronically Signed   By: Irish Lack M.D.   On: 08/29/2021 08:36   IR Angiogram Selective Each Additional Vessel  Result Date: 08/29/2021 INDICATION: Lower GI bleed and hypotension with positive CT angiography demonstrating active extravasation of contrast from a branch supplying the distal transverse colon. The patient has a single current functioning IV through which she is receiving blood. There has been inability to establish a second peripheral IV. EXAM: 1. ULTRASOUND-GUIDED PERIPHERAL INTRAVENOUS ACCESS 2. ULTRASOUND GUIDANCE FOR VASCULAR ACCESS OF THE RIGHT COMMON FEMORAL ARTERY 3. SELECTIVE ARTERIOGRAPHY OF THE SUPERIOR MESENTERIC ARTERY 4. ADDITIONAL SELECTIVE ARTERIOGRAPHY OF A THIRD ORDER SMA BRANCH 5. ADDITIONAL SELECTIVE ARTERIOGRAPHY A THIRD ORDER SMA BRANCH 6. ADDITIONAL SELECTIVE ARTERIOGRAPHY A FOURTH ORDER SMA BRANCH 7. TRANSCATHETER EMBOLIZATION OF SMA BRANCH 8. FOLLOW-UP ARTERIOGRAPHY AFTER EMBOLIZATION MEDICATIONS: None ANESTHESIA/SEDATION: Moderate (conscious) sedation was employed during this procedure. A total of Versed 3.0 mg and Fentanyl 100 mcg was administered intravenously. Moderate Sedation Time: 99 minutes. The patient's level of consciousness and vital signs were monitored continuously by radiology nursing throughout the procedure under my direct supervision. CONTRAST:  75 mL Omnipaque 350 FLUOROSCOPY TIME:  Fluoroscopy Time: 33 minutes.  716 mGy COMPLICATIONS: None immediate. PROCEDURE: Informed  consent was obtained from the patient's husband following explanation of the procedure, risks, benefits and alternatives. The patient and her husband understand, agree and consent for the procedure. All questions were addressed. The husband signed consent due to patient history of history of mild dementia. A time out was performed prior to the initiation of the procedure. Maximal barrier sterile technique utilized including caps, mask, sterile gowns, sterile gloves, large sterile drape, hand hygiene, and chlorhexidine prep. Ultrasound was used to confirm patency of the right basilic vein in the upper arm. Local anesthesia was provided with 1% lidocaine. Under ultrasound guidance, a 21 gauge needle was utilized to access the vein. Ultrasound image documentation was performed. A guidewire was advanced. A 4 French micropuncture dilator was advanced into the vein and secured for use as intravenous access. Ultrasound was used to confirm patency of the right common femoral artery. Local anesthesia was provided with 1% lidocaine. Under ultrasound guidance, a 21 gauge needle was advanced into the right common femoral artery. Ultrasound image documentation was performed. After placement of a micropuncture dilator, a 5 French sheath was then placed over a guidewire. A 5 French Cobra catheter was advanced into the abdominal aorta and used to selectively catheterize the superior mesenteric artery. Selective arteriography of the superior mesenteric artery was performed in different projections. A microcatheter was then advanced through the 5 French catheter and into the trunk of the superior mesenteric artery. A third order branch vessel was catheterized and selective arteriography performed. A separate second order SMA branch was then catheterized with a microcatheter followed by further catheterization of a third order branch. Selective arteriography was performed.  The microcatheter was then used to further catheterize a  fourth order branch. Selective arteriography was performed. Transcatheter embolization was performed with deployment of a single 1 mm x 2 cm Ramisa LP coil. Additional follow-up arteriography was performed. After catheter removal and sheath removal, hemostasis was obtained with use of the Cordis ExoSeal device. FINDINGS: Intravenous access was successfully stab list at the level of the right basilic vein in the upper arm. This access was utilized for conscious sedation during the procedure and was also kept in place for use during hospital admission. Initial selective arteriography of the superior mesenteric artery demonstrated normal patency of branch vessels without evidence of active bleeding, pseudoaneurysm, vascular malformation or other vascular abnormality. On the third selective SMA arteriogram, there was evidence active contrast extravasation at the level of the mid to distal transverse colon in the region of bleeding identified by CT angiography. 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 After mapping out the probable route of supply to the transverse colon, a tortuous proximal branch was eventually able to be catheterized that supplies the transverse colon. Initial selective arteriography at the level of this branch did not re-demonstrate active bleeding. However, with further advancement of the catheter into the origin of a fourth order descending transverse colonic branch, active extravasation of contrast material was again demonstrated. The microcatheter could not be advanced beyond the origin of this branch vessel due to tortuosity and small vessel caliber as well as probable component of spasm. Therefore, coil embolization was performed at the base of this vessel resulting in occlusion as well as some additional branch vessels distal to the coil. Follow-up  arteriography did not demonstrate active extravasation at the level of previously demonstrated bleeding. IMPRESSION: 1. Additional peripheral intravenous access established under ultrasound guidance for use for IV conscious sedation during the procedure. 2. Arteriography of the superior mesenteric artery as well as multiple distal branch vessel supply demonstrates active contrast extravasation from a fourth order descending branch supplying the transverse colon at the level of active bleeding identified by CT angiography. The origin of this branch vessel was able to be successfully catheterized with deployment of a single embolization coil resulting in branch vessel occlusion and lack of contrast extravasation following embolization. Electronically Signed   By: Irish LackGlenn  Yamagata M.D.   On: 08/29/2021 08:36   IR Angiogram Selective Each Additional Vessel  Result Date: 08/29/2021 INDICATION: Lower GI bleed and hypotension with positive CT angiography demonstrating active extravasation of contrast from a branch supplying the distal transverse colon. The patient has a single current functioning IV through which she is receiving blood. There has been inability to establish a second peripheral IV. EXAM: 1. ULTRASOUND-GUIDED PERIPHERAL INTRAVENOUS ACCESS 2. ULTRASOUND GUIDANCE FOR VASCULAR ACCESS OF THE RIGHT COMMON FEMORAL ARTERY 3. SELECTIVE ARTERIOGRAPHY OF THE SUPERIOR MESENTERIC ARTERY 4. ADDITIONAL SELECTIVE ARTERIOGRAPHY OF A THIRD ORDER SMA BRANCH 5. ADDITIONAL SELECTIVE ARTERIOGRAPHY A THIRD ORDER SMA BRANCH 6. ADDITIONAL SELECTIVE ARTERIOGRAPHY A FOURTH ORDER SMA BRANCH 7. TRANSCATHETER EMBOLIZATION OF SMA BRANCH 8. FOLLOW-UP ARTERIOGRAPHY AFTER EMBOLIZATION MEDICATIONS: None ANESTHESIA/SEDATION: Moderate (conscious) sedation was employed during this procedure. A total of Versed 3.0 mg and Fentanyl 100 mcg was administered intravenously. Moderate Sedation Time: 99 minutes. The patient's level of consciousness  and vital signs were monitored continuously by radiology nursing throughout the procedure under my direct supervision. CONTRAST:  75 mL Omnipaque 350 FLUOROSCOPY  TIME:  Fluoroscopy Time: 33 minutes.  716 mGy COMPLICATIONS: None immediate. PROCEDURE: Informed consent was obtained from the patient's husband following explanation of the procedure, risks, benefits and alternatives. The patient and her husband understand, agree and consent for the procedure. All questions were addressed. The husband signed consent due to patient history of history of mild dementia. A time out was performed prior to the initiation of the procedure. Maximal barrier sterile technique utilized including caps, mask, sterile gowns, sterile gloves, large sterile drape, hand hygiene, and chlorhexidine prep. Ultrasound was used to confirm patency of the right basilic vein in the upper arm. Local anesthesia was provided with 1% lidocaine. Under ultrasound guidance, a 21 gauge needle was utilized to access the vein. Ultrasound image documentation was performed. A guidewire was advanced. A 4 French micropuncture dilator was advanced into the vein and secured for use as intravenous access. Ultrasound was used to confirm patency of the right common femoral artery. Local anesthesia was provided with 1% lidocaine. Under ultrasound guidance, a 21 gauge needle was advanced into the right common femoral artery. Ultrasound image documentation was performed. After placement of a micropuncture dilator, a 5 French sheath was then placed over a guidewire. A 5 French Cobra catheter was advanced into the abdominal aorta and used to selectively catheterize the superior mesenteric artery. Selective arteriography of the superior mesenteric artery was performed in different projections. A microcatheter was then advanced through the 5 French catheter and into the trunk of the superior mesenteric artery. A third order branch vessel was catheterized and selective  arteriography performed. A separate second order SMA branch was then catheterized with a microcatheter followed by further catheterization of a third order branch. Selective arteriography was performed. The microcatheter was then used to further catheterize a fourth order branch. Selective arteriography was performed. Transcatheter embolization was performed with deployment of a single 1 mm x 2 cm Analycia LP coil. Additional follow-up arteriography was performed. After catheter removal and sheath removal, hemostasis was obtained with use of the Cordis ExoSeal device. FINDINGS: Intravenous access was successfully stab list at the level of the right basilic vein in the upper arm. This access was utilized for conscious sedation during the procedure and was also kept in place for use during hospital admission. Initial selective arteriography of the superior mesenteric artery demonstrated normal patency of branch vessels without evidence of active bleeding, pseudoaneurysm, vascular malformation or other vascular abnormality. On the third selective SMA arteriogram, there was evidence active contrast extravasation at the level of the mid to distal transverse colon in the region of bleeding identified by CT angiography. 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 After mapping out the probable route of supply to the transverse colon, a tortuous proximal branch was eventually able to be catheterized that supplies the transverse colon. Initial selective arteriography at the level of this branch did not re-demonstrate active bleeding. However, with further advancement of the catheter into the origin of a fourth order descending transverse colonic branch, active extravasation of contrast material was again demonstrated. The microcatheter could not be advanced beyond the origin of this  branch vessel due to tortuosity and small vessel caliber as well as probable component of spasm. Therefore, coil embolization was performed at the base of this vessel resulting in occlusion as well as some additional branch vessels distal to the coil. Follow-up arteriography did not demonstrate  active extravasation at the level of previously demonstrated bleeding. IMPRESSION: 1. Additional peripheral intravenous access established under ultrasound guidance for use for IV conscious sedation during the procedure. 2. Arteriography of the superior mesenteric artery as well as multiple distal branch vessel supply demonstrates active contrast extravasation from a fourth order descending branch supplying the transverse colon at the level of active bleeding identified by CT angiography. The origin of this branch vessel was able to be successfully catheterized with deployment of a single embolization coil resulting in branch vessel occlusion and lack of contrast extravasation following embolization. Electronically Signed   By: Irish Lack M.D.   On: 08/29/2021 08:36   IR Angiogram Selective Each Additional Vessel  Result Date: 08/29/2021 INDICATION: Lower GI bleed and hypotension with positive CT angiography demonstrating active extravasation of contrast from a branch supplying the distal transverse colon. The patient has a single current functioning IV through which she is receiving blood. There has been inability to establish a second peripheral IV. EXAM: 1. ULTRASOUND-GUIDED PERIPHERAL INTRAVENOUS ACCESS 2. ULTRASOUND GUIDANCE FOR VASCULAR ACCESS OF THE RIGHT COMMON FEMORAL ARTERY 3. SELECTIVE ARTERIOGRAPHY OF THE SUPERIOR MESENTERIC ARTERY 4. ADDITIONAL SELECTIVE ARTERIOGRAPHY OF A THIRD ORDER SMA BRANCH 5. ADDITIONAL SELECTIVE ARTERIOGRAPHY A THIRD ORDER SMA BRANCH 6. ADDITIONAL SELECTIVE ARTERIOGRAPHY A FOURTH ORDER SMA BRANCH 7. TRANSCATHETER EMBOLIZATION OF SMA BRANCH 8. FOLLOW-UP ARTERIOGRAPHY AFTER  EMBOLIZATION MEDICATIONS: None ANESTHESIA/SEDATION: Moderate (conscious) sedation was employed during this procedure. A total of Versed 3.0 mg and Fentanyl 100 mcg was administered intravenously. Moderate Sedation Time: 99 minutes. The patient's level of consciousness and vital signs were monitored continuously by radiology nursing throughout the procedure under my direct supervision. CONTRAST:  75 mL Omnipaque 350 FLUOROSCOPY TIME:  Fluoroscopy Time: 33 minutes.  716 mGy COMPLICATIONS: None immediate. PROCEDURE: Informed consent was obtained from the patient's husband following explanation of the procedure, risks, benefits and alternatives. The patient and her husband understand, agree and consent for the procedure. All questions were addressed. The husband signed consent due to patient history of history of mild dementia. A time out was performed prior to the initiation of the procedure. Maximal barrier sterile technique utilized including caps, mask, sterile gowns, sterile gloves, large sterile drape, hand hygiene, and chlorhexidine prep. Ultrasound was used to confirm patency of the right basilic vein in the upper arm. Local anesthesia was provided with 1% lidocaine. Under ultrasound guidance, a 21 gauge needle was utilized to access the vein. Ultrasound image documentation was performed. A guidewire was advanced. A 4 French micropuncture dilator was advanced into the vein and secured for use as intravenous access. Ultrasound was used to confirm patency of the right common femoral artery. Local anesthesia was provided with 1% lidocaine. Under ultrasound guidance, a 21 gauge needle was advanced into the right common femoral artery. Ultrasound image documentation was performed. After placement of a micropuncture dilator, a 5 French sheath was then placed over a guidewire. A 5 French Cobra catheter was advanced into the abdominal aorta and used to selectively catheterize the superior mesenteric artery. Selective  arteriography of the superior mesenteric artery was performed in different projections. A microcatheter was then advanced through the 5 French catheter and into the trunk of the superior mesenteric artery. A third order branch vessel was catheterized and selective arteriography performed. A separate second order SMA branch was then catheterized with a microcatheter followed by further catheterization of a third order branch. Selective arteriography was performed. The microcatheter was then used to further catheterize a fourth order  branch. Selective arteriography was performed. Transcatheter embolization was performed with deployment of a single 1 mm x 2 cm Kirby LP coil. Additional follow-up arteriography was performed. After catheter removal and sheath removal, hemostasis was obtained with use of the Cordis ExoSeal device. FINDINGS: Intravenous access was successfully stab list at the level of the right basilic vein in the upper arm. This access was utilized for conscious sedation during the procedure and was also kept in place for use during hospital admission. Initial selective arteriography of the superior mesenteric artery demonstrated normal patency of branch vessels without evidence of active bleeding, pseudoaneurysm, vascular malformation or other vascular abnormality. On the third selective SMA arteriogram, there was evidence active contrast extravasation at the level of the mid to distal transverse colon in the region of bleeding identified by CT angiography. 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 After mapping out the probable route of supply to the transverse colon, a tortuous proximal branch was eventually able to be catheterized that supplies the transverse colon. Initial selective arteriography at the level of this branch did not re-demonstrate active  bleeding. However, with further advancement of the catheter into the origin of a fourth order descending transverse colonic branch, active extravasation of contrast material was again demonstrated. The microcatheter could not be advanced beyond the origin of this branch vessel due to tortuosity and small vessel caliber as well as probable component of spasm. Therefore, coil embolization was performed at the base of this vessel resulting in occlusion as well as some additional branch vessels distal to the coil. Follow-up arteriography did not demonstrate active extravasation at the level of previously demonstrated bleeding. IMPRESSION: 1. Additional peripheral intravenous access established under ultrasound guidance for use for IV conscious sedation during the procedure. 2. Arteriography of the superior mesenteric artery as well as multiple distal branch vessel supply demonstrates active contrast extravasation from a fourth order descending branch supplying the transverse colon at the level of active bleeding identified by CT angiography. The origin of this branch vessel was able to be successfully catheterized with deployment of a single embolization coil resulting in branch vessel occlusion and lack of contrast extravasation following embolization. Electronically Signed   By: Irish Lack M.D.   On: 08/29/2021 08:36   IR Angiogram Follow Up Study  Result Date: 08/29/2021 INDICATION: Lower GI bleed and hypotension with positive CT angiography demonstrating active extravasation of contrast from a branch supplying the distal transverse colon. The patient has a single current functioning IV through which she is receiving blood. There has been inability to establish a second peripheral IV. EXAM: 1. ULTRASOUND-GUIDED PERIPHERAL INTRAVENOUS ACCESS 2. ULTRASOUND GUIDANCE FOR VASCULAR ACCESS OF THE RIGHT COMMON FEMORAL ARTERY 3. SELECTIVE ARTERIOGRAPHY OF THE SUPERIOR MESENTERIC ARTERY 4. ADDITIONAL SELECTIVE  ARTERIOGRAPHY OF A THIRD ORDER SMA BRANCH 5. ADDITIONAL SELECTIVE ARTERIOGRAPHY A THIRD ORDER SMA BRANCH 6. ADDITIONAL SELECTIVE ARTERIOGRAPHY A FOURTH ORDER SMA BRANCH 7. TRANSCATHETER EMBOLIZATION OF SMA BRANCH 8. FOLLOW-UP ARTERIOGRAPHY AFTER EMBOLIZATION MEDICATIONS: None ANESTHESIA/SEDATION: Moderate (conscious) sedation was employed during this procedure. A total of Versed 3.0 mg and Fentanyl 100 mcg was administered intravenously. Moderate Sedation Time: 99 minutes. The patient's level of consciousness and vital signs were monitored continuously by radiology nursing throughout the procedure under my direct supervision. CONTRAST:  75 mL Omnipaque 350 FLUOROSCOPY TIME:  Fluoroscopy Time: 33 minutes.  716 mGy COMPLICATIONS: None immediate. PROCEDURE:  Informed consent was obtained from the patient's husband following explanation of the procedure, risks, benefits and alternatives. The patient and her husband understand, agree and consent for the procedure. All questions were addressed. The husband signed consent due to patient history of history of mild dementia. A time out was performed prior to the initiation of the procedure. Maximal barrier sterile technique utilized including caps, mask, sterile gowns, sterile gloves, large sterile drape, hand hygiene, and chlorhexidine prep. Ultrasound was used to confirm patency of the right basilic vein in the upper arm. Local anesthesia was provided with 1% lidocaine. Under ultrasound guidance, a 21 gauge needle was utilized to access the vein. Ultrasound image documentation was performed. A guidewire was advanced. A 4 French micropuncture dilator was advanced into the vein and secured for use as intravenous access. Ultrasound was used to confirm patency of the right common femoral artery. Local anesthesia was provided with 1% lidocaine. Under ultrasound guidance, a 21 gauge needle was advanced into the right common femoral artery. Ultrasound image documentation was  performed. After placement of a micropuncture dilator, a 5 French sheath was then placed over a guidewire. A 5 French Cobra catheter was advanced into the abdominal aorta and used to selectively catheterize the superior mesenteric artery. Selective arteriography of the superior mesenteric artery was performed in different projections. A microcatheter was then advanced through the 5 French catheter and into the trunk of the superior mesenteric artery. A third order branch vessel was catheterized and selective arteriography performed. A separate second order SMA branch was then catheterized with a microcatheter followed by further catheterization of a third order branch. Selective arteriography was performed. The microcatheter was then used to further catheterize a fourth order branch. Selective arteriography was performed. Transcatheter embolization was performed with deployment of a single 1 mm x 2 cm [place]le[place] LP coil. Additional follow-up arteriography was performed. After catheter removal and sheath removal, hemostasis was obtained with use of the Cordis ExoSeal device. FINDINGS: Intravenous access was successfully stab list at the level of the right basilic vein in the upper arm. This access was utilized for conscious sedation during the procedure and was also kept in place for use during hospital admission. Initial selective arteriography of the superior mesenteric artery demonstrated normal patency of branch vessels without evidence of active bleeding, pseudoaneurysm, vascular malformation or other vascular abnormality. On the third selective SMA arteriogram, there was evidence active contrast extravasation at the level of the mid to distal transverse colon in the region of bleeding identified by CT angiography. 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 After mapping  out the probable route of supply to the transverse colon, a tortuous proximal branch was eventually able to be catheterized that supplies the transverse colon. Initial selective arteriography at the level of this branch did not re-demonstrate active bleeding. However, with further advancement of the catheter into the origin of a fourth order descending transverse colonic branch, active extravasation of contrast material was again demonstrated. The microcatheter could not be advanced beyond the origin of this branch vessel due to tortuosity and small vessel caliber as well as probable component of spasm. Therefore, coil embolization was performed at the base of this vessel resulting in occlusion as well as some additional branch vessels distal to the coil. Follow-up arteriography did not demonstrate active extravasation at the level of previously demonstrated bleeding. IMPRESSION: 1. Additional peripheral  intravenous access established under ultrasound guidance for use for IV conscious sedation during the procedure. 2. Arteriography of the superior mesenteric artery as well as multiple distal branch vessel supply demonstrates active contrast extravasation from a fourth order descending branch supplying the transverse colon at the level of active bleeding identified by CT angiography. The origin of this branch vessel was able to be successfully catheterized with deployment of a single embolization coil resulting in branch vessel occlusion and lack of contrast extravasation following embolization. Electronically Signed   By: Irish Lack M.D.   On: 08/29/2021 08:36   IR US Guide Vasc Access Right  Result Date: 08/29/2021 INDICATION: Lower GI bleed and hypotension with positive CT angiography demonstrating active extravasation of contrast from a branch supplying the distal transverse colon. The patient has a single current functioning IV through which she is receiving blood. There has been inability to establish a  second peripheral IV. EXAM: 1. ULTRASOUND-GUIDED PERIPHERAL INTRAVENOUS ACCESS 2. ULTRASOUND GUIDANCE FOR VASCULAR ACCESS OF THE RIGHT COMMON FEMORAL ARTERY 3. SELECTIVE ARTERIOGRAPHY OF THE SUPERIOR MESENTERIC ARTERY 4. ADDITIONAL SELECTIVE ARTERIOGRAPHY OF A THIRD ORDER SMA BRANCH 5. ADDITIONAL SELECTIVE ARTERIOGRAPHY A THIRD ORDER SMA BRANCH 6. ADDITIONAL SELECTIVE ARTERIOGRAPHY A FOURTH ORDER SMA BRANCH 7. TRANSCATHETER EMBOLIZATION OF SMA BRANCH 8. FOLLOW-UP ARTERIOGRAPHY AFTER EMBOLIZATION MEDICATIONS: None ANESTHESIA/SEDATION: Moderate (conscious) sedation was employed during this procedure. A total of Versed 3.0 mg and Fentanyl 100 mcg was administered intravenously. Moderate Sedation Time: 99 minutes. The patient's level of consciousness and vital signs were monitored continuously by radiology nursing throughout the procedure under my direct supervision. CONTRAST:  75 mL Omnipaque 350 FLUOROSCOPY TIME:  Fluoroscopy Time: 33 minutes.  716 mGy COMPLICATIONS: None immediate. PROCEDURE: Informed consent was obtained from the patient's husband following explanation of the procedure, risks, benefits and alternatives. The patient and her husband understand, agree and consent for the procedure. All questions were addressed. The husband signed consent due to patient history of history of mild dementia. A time out was performed prior to the initiation of the procedure. Maximal barrier sterile technique utilized including caps, mask, sterile gowns, sterile gloves, large sterile drape, hand hygiene, and chlorhexidine prep. Ultrasound was used to confirm patency of the right basilic vein in the upper arm. Local anesthesia was provided with 1% lidocaine. Under ultrasound guidance, a 21 gauge needle was utilized to access the vein. Ultrasound image documentation was performed. A guidewire was advanced. A 4 French micropuncture dilator was advanced into the vein and secured for use as intravenous access. Ultrasound was  used to confirm patency of the right common femoral artery. Local anesthesia was provided with 1% lidocaine. Under ultrasound guidance, a 21 gauge needle was advanced into the right common femoral artery. Ultrasound image documentation was performed. After placement of a micropuncture dilator, a 5 French sheath was then placed over a guidewire. A 5 French Cobra catheter was advanced into the abdominal aorta and used to selectively catheterize the superior mesenteric artery. Selective arteriography of the superior mesenteric artery was performed in different projections. A microcatheter was then advanced through the 5 French catheter and into the trunk of the superior mesenteric artery. A third order branch vessel was catheterized and selective arteriography performed. A separate second order SMA branch was then catheterized with a microcatheter followed by further catheterization of a third order branch. Selective arteriography was performed. The microcatheter was then used to further catheterize a fourth order branch. Selective arteriography was performed. Transcatheter embolization was performed with deployment of a  single 1 mm x 2 cm Arilyn LP coil. Additional follow-up arteriography was performed. After catheter removal and sheath removal, hemostasis was obtained with use of the Cordis ExoSeal device. FINDINGS: Intravenous access was successfully stab list at the level of the right basilic vein in the upper arm. This access was utilized for conscious sedation during the procedure and was also kept in place for use during hospital admission. Initial selective arteriography of the superior mesenteric artery demonstrated normal patency of branch vessels without evidence of active bleeding, pseudoaneurysm, vascular malformation or other vascular abnormality. On the third selective SMA arteriogram, there was evidence active contrast extravasation at the level of the mid to distal transverse colon in the region of  bleeding identified by CT angiography. 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 After mapping out the probable route of supply to the transverse colon, a tortuous proximal branch was eventually able to be catheterized that supplies the transverse colon. Initial selective arteriography at the level of this branch did not re-demonstrate active bleeding. However, with further advancement of the catheter into the origin of a fourth order descending transverse colonic branch, active extravasation of contrast material was again demonstrated. The microcatheter could not be advanced beyond the origin of this branch vessel due to tortuosity and small vessel caliber as well as probable component of spasm. Therefore, coil embolization was performed at the base of this vessel resulting in occlusion as well as some additional branch vessels distal to the coil. Follow-up arteriography did not demonstrate active extravasation at the level of previously demonstrated bleeding. IMPRESSION: 1. Additional peripheral intravenous access established under ultrasound guidance for use for IV conscious sedation during the procedure. 2. Arteriography of the superior mesenteric artery as well as multiple distal branch vessel supply demonstrates active contrast extravasation from a fourth order descending branch supplying the transverse colon at the level of active bleeding identified by CT angiography. The origin of this branch vessel was able to be successfully catheterized with deployment of a single embolization coil resulting in branch vessel occlusion and lack of contrast extravasation following embolization. Electronically Signed   By: Irish Lack M.D.   On: 08/29/2021 08:36   IR US Guide Vasc Access Right  Result Date: 08/29/2021 INDICATION: Lower GI bleed and hypotension with  positive CT angiography demonstrating active extravasation of contrast from a branch supplying the distal transverse colon. The patient has a single current functioning IV through which she is receiving blood. There has been inability to establish a second peripheral IV. EXAM: 1. ULTRASOUND-GUIDED PERIPHERAL INTRAVENOUS ACCESS 2. ULTRASOUND GUIDANCE FOR VASCULAR ACCESS OF THE RIGHT COMMON FEMORAL ARTERY 3. SELECTIVE ARTERIOGRAPHY OF THE SUPERIOR MESENTERIC ARTERY 4. ADDITIONAL SELECTIVE ARTERIOGRAPHY OF A THIRD ORDER SMA BRANCH 5. ADDITIONAL SELECTIVE ARTERIOGRAPHY A THIRD ORDER SMA BRANCH 6. ADDITIONAL SELECTIVE ARTERIOGRAPHY A FOURTH ORDER SMA BRANCH 7. TRANSCATHETER EMBOLIZATION OF SMA BRANCH 8. FOLLOW-UP ARTERIOGRAPHY AFTER EMBOLIZATION MEDICATIONS: None ANESTHESIA/SEDATION: Moderate (conscious) sedation was employed during this procedure. A total of Versed 3.0 mg and Fentanyl 100 mcg was administered intravenously. Moderate Sedation Time: 99 minutes. The patient's level of consciousness and vital signs were monitored continuously by radiology nursing throughout the procedure under my direct supervision. CONTRAST:  75 mL Omnipaque 350 FLUOROSCOPY TIME:  Fluoroscopy Time: 33 minutes.  716 mGy COMPLICATIONS: None immediate. PROCEDURE: Informed consent was obtained from the patient's husband following explanation of the  procedure, risks, benefits and alternatives. The patient and her husband understand, agree and consent for the procedure. All questions were addressed. The husband signed consent due to patient history of history of mild dementia. A time out was performed prior to the initiation of the procedure. Maximal barrier sterile technique utilized including caps, mask, sterile gowns, sterile gloves, large sterile drape, hand hygiene, and chlorhexidine prep. Ultrasound was used to confirm patency of the right basilic vein in the upper arm. Local anesthesia was provided with 1% lidocaine. Under ultrasound  guidance, a 21 gauge needle was utilized to access the vein. Ultrasound image documentation was performed. A guidewire was advanced. A 4 French micropuncture dilator was advanced into the vein and secured for use as intravenous access. Ultrasound was used to confirm patency of the right common femoral artery. Local anesthesia was provided with 1% lidocaine. Under ultrasound guidance, a 21 gauge needle was advanced into the right common femoral artery. Ultrasound image documentation was performed. After placement of a micropuncture dilator, a 5 French sheath was then placed over a guidewire. A 5 French Cobra catheter was advanced into the abdominal aorta and used to selectively catheterize the superior mesenteric artery. Selective arteriography of the superior mesenteric artery was performed in different projections. A microcatheter was then advanced through the 5 French catheter and into the trunk of the superior mesenteric artery. A third order branch vessel was catheterized and selective arteriography performed. A separate second order SMA branch was then catheterized with a microcatheter followed by further catheterization of a third order branch. Selective arteriography was performed. The microcatheter was then used to further catheterize a fourth order branch. Selective arteriography was performed. Transcatheter embolization was performed with deployment of a single 1 mm x 2 cm Lynne LP coil. Additional follow-up arteriography was performed. After catheter removal and sheath removal, hemostasis was obtained with use of the Cordis ExoSeal device. FINDINGS: Intravenous access was successfully stab list at the level of the right basilic vein in the upper arm. This access was utilized for conscious sedation during the procedure and was also kept in place for use during hospital admission. Initial selective arteriography of the superior mesenteric artery demonstrated normal patency of branch vessels without evidence  of active bleeding, pseudoaneurysm, vascular malformation or other vascular abnormality. On the third selective SMA arteriogram, there was evidence active contrast extravasation at the level of the mid to distal transverse colon in the region of bleeding identified by CT angiography. 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 After mapping out the probable route of supply to the transverse colon, a tortuous proximal branch was eventually able to be catheterized that supplies the transverse colon. Initial selective arteriography at the level of this branch did not re-demonstrate active bleeding. However, with further advancement of the catheter into the origin of a fourth order descending transverse colonic branch, active extravasation of contrast material was again demonstrated. The microcatheter could not be advanced beyond the origin of this branch vessel due to tortuosity and small vessel caliber as well as probable component of spasm. Therefore, coil embolization was performed at the base of this vessel resulting in occlusion as well as some additional branch vessels distal to the coil. Follow-up arteriography did not demonstrate active extravasation at the level of previously demonstrated bleeding. IMPRESSION: 1. Additional peripheral intravenous access established under ultrasound guidance for use for IV conscious sedation  during the procedure. 2. Arteriography of the superior mesenteric artery as well as multiple distal branch vessel supply demonstrates active contrast extravasation from a fourth order descending branch supplying the transverse colon at the level of active bleeding identified by CT angiography. The origin of this branch vessel was able to be successfully catheterized with deployment of a single embolization coil resulting in branch vessel occlusion  and lack of contrast extravasation following embolization. Electronically Signed   By: Irish Lack M.D.   On: 08/29/2021 08:36   IR RADIOLOGY PERIPHERAL GUIDED IV START  Result Date: 08/29/2021 INDICATION: Lower GI bleed and hypotension with positive CT angiography demonstrating active extravasation of contrast from a branch supplying the distal transverse colon. The patient has a single current functioning IV through which she is receiving blood. There has been inability to establish a second peripheral IV. EXAM: 1. ULTRASOUND-GUIDED PERIPHERAL INTRAVENOUS ACCESS 2. ULTRASOUND GUIDANCE FOR VASCULAR ACCESS OF THE RIGHT COMMON FEMORAL ARTERY 3. SELECTIVE ARTERIOGRAPHY OF THE SUPERIOR MESENTERIC ARTERY 4. ADDITIONAL SELECTIVE ARTERIOGRAPHY OF A THIRD ORDER SMA BRANCH 5. ADDITIONAL SELECTIVE ARTERIOGRAPHY A THIRD ORDER SMA BRANCH 6. ADDITIONAL SELECTIVE ARTERIOGRAPHY A FOURTH ORDER SMA BRANCH 7. TRANSCATHETER EMBOLIZATION OF SMA BRANCH 8. FOLLOW-UP ARTERIOGRAPHY AFTER EMBOLIZATION MEDICATIONS: None ANESTHESIA/SEDATION: Moderate (conscious) sedation was employed during this procedure. A total of Versed 3.0 mg and Fentanyl 100 mcg was administered intravenously. Moderate Sedation Time: 99 minutes. The patient's level of consciousness and vital signs were monitored continuously by radiology nursing throughout the procedure under my direct supervision. CONTRAST:  75 mL Omnipaque 350 FLUOROSCOPY TIME:  Fluoroscopy Time: 33 minutes.  716 mGy COMPLICATIONS: None immediate. PROCEDURE: Informed consent was obtained from the patient's husband following explanation of the procedure, risks, benefits and alternatives. The patient and her husband understand, agree and consent for the procedure. All questions were addressed. The husband signed consent due to patient history of history of mild dementia. A time out was performed prior to the initiation of the procedure. Maximal barrier sterile technique utilized including caps,  mask, sterile gowns, sterile gloves, large sterile drape, hand hygiene, and chlorhexidine prep. Ultrasound was used to confirm patency of the right basilic vein in the upper arm. Local anesthesia was provided with 1% lidocaine. Under ultrasound guidance, a 21 gauge needle was utilized to access the vein. Ultrasound image documentation was performed. A guidewire was advanced. A 4 French micropuncture dilator was advanced into the vein and secured for use as intravenous access. Ultrasound was used to confirm patency of the right common femoral artery. Local anesthesia was provided with 1% lidocaine. Under ultrasound guidance, a 21 gauge needle was advanced into the right common femoral artery. Ultrasound image documentation was performed. After placement of a micropuncture dilator, a 5 French sheath was then placed over a guidewire. A 5 French Cobra catheter was advanced into the abdominal aorta and used to selectively catheterize the superior mesenteric artery. Selective arteriography of the superior mesenteric artery was performed in different projections. A microcatheter was then advanced through the 5 French catheter and into the trunk of the superior mesenteric artery. A third order branch vessel was catheterized and selective arteriography performed. A separate second order SMA branch was then catheterized with a microcatheter followed by further catheterization of a third order branch. Selective arteriography was performed. The microcatheter was then used to further catheterize a fourth order branch. Selective arteriography was performed. Transcatheter embolization was performed with deployment of a single 1 mm x 2 cm Domenique LP coil. Additional follow-up arteriography  was performed. After catheter removal and sheath removal, hemostasis was obtained with use of the Cordis ExoSeal device. FINDINGS: Intravenous access was successfully stab list at the level of the right basilic vein in the upper arm. This access was  utilized for conscious sedation during the procedure and was also kept in place for use during hospital admission. Initial selective arteriography of the superior mesenteric artery demonstrated normal patency of branch vessels without evidence of active bleeding, pseudoaneurysm, vascular malformation or other vascular abnormality. On the third selective SMA arteriogram, there was evidence active contrast extravasation at the level of the mid to distal transverse colon in the region of bleeding identified by CT angiography. 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 After mapping out the probable route of supply to the transverse colon, a tortuous proximal branch was eventually able to be catheterized that supplies the transverse colon. Initial selective arteriography at the level of this branch did not re-demonstrate active bleeding. However, with further advancement of the catheter into the origin of a fourth order descending transverse colonic branch, active extravasation of contrast material was again demonstrated. The microcatheter could not be advanced beyond the origin of this branch vessel due to tortuosity and small vessel caliber as well as probable component of spasm. Therefore, coil embolization was performed at the base of this vessel resulting in occlusion as well as some additional branch vessels distal to the coil. Follow-up arteriography did not demonstrate active extravasation at the level of previously demonstrated bleeding. IMPRESSION: 1. Additional peripheral intravenous access established under ultrasound guidance for use for IV conscious sedation during the procedure. 2. Arteriography of the superior mesenteric artery as well as multiple distal branch vessel supply demonstrates active contrast extravasation from a fourth order descending branch  supplying the transverse colon at the level of active bleeding identified by CT angiography. The origin of this branch vessel was able to be successfully catheterized with deployment of a single embolization coil resulting in branch vessel occlusion and lack of contrast extravasation following embolization. Electronically Signed   By: Irish Lack M.D.   On: 08/29/2021 08:36   IR EMBO ART  VEN HEMORR LYMPH EXTRAV  INC GUIDE ROADMAPPING  Result Date: 08/29/2021 INDICATION: Lower GI bleed and hypotension with positive CT angiography demonstrating active extravasation of contrast from a branch supplying the distal transverse colon. The patient has a single current functioning IV through which she is receiving blood. There has been inability to establish a second peripheral IV. EXAM: 1. ULTRASOUND-GUIDED PERIPHERAL INTRAVENOUS ACCESS 2. ULTRASOUND GUIDANCE FOR VASCULAR ACCESS OF THE RIGHT COMMON FEMORAL ARTERY 3. SELECTIVE ARTERIOGRAPHY OF THE SUPERIOR MESENTERIC ARTERY 4. ADDITIONAL SELECTIVE ARTERIOGRAPHY OF A THIRD ORDER SMA BRANCH 5. ADDITIONAL SELECTIVE ARTERIOGRAPHY A THIRD ORDER SMA BRANCH 6. ADDITIONAL SELECTIVE ARTERIOGRAPHY A FOURTH ORDER SMA BRANCH 7. TRANSCATHETER EMBOLIZATION OF SMA BRANCH 8. FOLLOW-UP ARTERIOGRAPHY AFTER EMBOLIZATION MEDICATIONS: None ANESTHESIA/SEDATION: Moderate (conscious) sedation was employed during this procedure. A total of Versed 3.0 mg and Fentanyl 100 mcg was administered intravenously. Moderate Sedation Time: 99 minutes. The patient's level of consciousness and vital signs were monitored continuously by radiology nursing throughout the procedure under my direct supervision. CONTRAST:  75 mL Omnipaque 350 FLUOROSCOPY TIME:  Fluoroscopy Time: 33 minutes.  716 mGy COMPLICATIONS: None immediate. PROCEDURE: Informed consent was obtained from the patient's husband following explanation of the procedure, risks, benefits and alternatives. The  patient and her husband understand,  agree and consent for the procedure. All questions were addressed. The husband signed consent due to patient history of history of mild dementia. A time out was performed prior to the initiation of the procedure. Maximal barrier sterile technique utilized including caps, mask, sterile gowns, sterile gloves, large sterile drape, hand hygiene, and chlorhexidine prep. Ultrasound was used to confirm patency of the right basilic vein in the upper arm. Local anesthesia was provided with 1% lidocaine. Under ultrasound guidance, a 21 gauge needle was utilized to access the vein. Ultrasound image documentation was performed. A guidewire was advanced. A 4 French micropuncture dilator was advanced into the vein and secured for use as intravenous access. Ultrasound was used to confirm patency of the right common femoral artery. Local anesthesia was provided with 1% lidocaine. Under ultrasound guidance, a 21 gauge needle was advanced into the right common femoral artery. Ultrasound image documentation was performed. After placement of a micropuncture dilator, a 5 French sheath was then placed over a guidewire. A 5 French Cobra catheter was advanced into the abdominal aorta and used to selectively catheterize the superior mesenteric artery. Selective arteriography of the superior mesenteric artery was performed in different projections. A microcatheter was then advanced through the 5 French catheter and into the trunk of the superior mesenteric artery. A third order branch vessel was catheterized and selective arteriography performed. A separate second order SMA branch was then catheterized with a microcatheter followed by further catheterization of a third order branch. Selective arteriography was performed. The microcatheter was then used to further catheterize a fourth order branch. Selective arteriography was performed. Transcatheter embolization was performed with deployment of a single 1 mm x 2 cm Naika LP coil. Additional  follow-up arteriography was performed. After catheter removal and sheath removal, hemostasis was obtained with use of the Cordis ExoSeal device. FINDINGS: Intravenous access was successfully stab list at the level of the right basilic vein in the upper arm. This access was utilized for conscious sedation during the procedure and was also kept in place for use during hospital admission. Initial selective arteriography of the superior mesenteric artery demonstrated normal patency of branch vessels without evidence of active bleeding, pseudoaneurysm, vascular malformation or other vascular abnormality. On the third selective SMA arteriogram, there was evidence active contrast extravasation at the level of the mid to distal transverse colon in the region of bleeding identified by CT angiography. 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 After mapping out the probable route of supply to the transverse colon, a tortuous proximal branch was eventually able to be catheterized that supplies the transverse colon. Initial selective arteriography at the level of this branch did not re-demonstrate active bleeding. However, with further advancement of the catheter into the origin of a fourth order descending transverse colonic branch, active extravasation of contrast material was again demonstrated. The microcatheter could not be advanced beyond the origin of this branch vessel due to tortuosity and small vessel caliber as well as probable component of spasm. Therefore, coil embolization was performed at the base of this vessel resulting in occlusion as well as some additional branch vessels distal to the coil. Follow-up arteriography did not demonstrate active extravasation at the level of previously demonstrated bleeding. IMPRESSION: 1. Additional peripheral intravenous access  established under ultrasound guidance for use for IV conscious sedation during the procedure. 2. Arteriography  of the superior mesenteric artery as well as multiple distal branch vessel supply demonstrates active contrast extravasation from a fourth order descending branch supplying the transverse colon at the level of active bleeding identified by CT angiography. The origin of this branch vessel was able to be successfully catheterized with deployment of a single embolization coil resulting in branch vessel occlusion and lack of contrast extravasation following embolization. Electronically Signed   By: Irish Lack M.D.   On: 08/29/2021 08:36   CT ANGIO GI BLEED  Result Date: 08/27/2021 CLINICAL DATA:  GI bleed. EXAM: CTA ABDOMEN AND PELVIS WITHOUT AND WITH CONTRAST TECHNIQUE: Multidetector CT imaging of the abdomen and pelvis was performed using the standard protocol during bolus administration of intravenous contrast. Multiplanar reconstructed images and MIPs were obtained and reviewed to evaluate the vascular anatomy. CONTRAST:  OMNIPAQUE IOHEXOL 350 MG/ML SOLN COMPARISON:  Abdominal CTA 2 days ago 08/25/2021 FINDINGS: VASCULAR Aorta: Mild atherosclerosis. No aneurysm, dissection, or significant stenosis. No periaortic stranding or inflammation. Celiac: The right hepatic artery has a separate origin arising from the abdominal aorta. Celiac artery is patent. Distal branch vessels are patent. SMA: Patent without evidence of aneurysm, dissection, vasculitis or significant stenosis. Renals: Mild calcified plaque in the origin of the renal arteries without significant stenosis. No acute findings or change from prior. Single bilateral renal arteries. IMA: Patent. Inflow: Patent without evidence of aneurysm, dissection, vasculitis or significant stenosis. Iliac tortuosity again seen. Proximal Outflow: Bilateral common femoral and visualized portions of the superficial and profunda femoral arteries are  patent without evidence of aneurysm, dissection, vasculitis or significant stenosis. Veins: No obvious venous abnormality within the limitations of this arterial phase study. Review of the MIP images confirms the above findings. NON-VASCULAR Lower chest: Lung bases are clear. No acute airspace disease or pleural effusion. Hepatobiliary: No focal hepatic abnormality. There is vicarious excretion of IV contrast in the gallbladder accounting for hyperdense appearance on the current exam. No biliary dilatation or pericholecystic inflammation. Pancreas: No ductal dilatation or inflammation. Spleen: Normal in size without focal abnormality. Adrenals/Urinary Tract: No adrenal nodule. Homogeneous renal enhancement without hydronephrosis. Again seen right renal cysts. No suspicious renal lesion. Unremarkable urinary bladder. Stomach/Bowel: Examination is positive for active extravasation of contrast into the GI tract. There is extravasation of contrast into the mid transverse colon, series 10, image 94, that increases on venous phase. No additional sites of IV contrast extravasation. Diffuse colonic diverticulosis without diverticulitis. Normal appendix. Lymphatic: No abdominopelvic adenopathy. Reproductive: Partial hysterectomy with prominence of the vaginal cuff unchanged. No adnexal mass. Other: No ascites or free air. Small fat containing umbilical hernia. Small bilateral fat containing inguinal hernias. Musculoskeletal: There are no acute or suspicious osseous abnormalities. Transitional lumbosacral anatomy with enlarged transverse processes and pseudoarticulation of the sacrum with L5. Mild bilateral hip osteoarthritis. IMPRESSION: VASCULAR 1. Examination is positive for active GI bleed in the mid distal transverse colon. 2. Mild aortic atherosclerosis. NON-VASCULAR 1. Colonic diverticulosis without focal diverticulitis. 2. Small fat containing bilateral inguinal and umbilical hernias. Electronically Signed   By:  Narda Rutherford M.D.   On: 08/27/2021 21:31    Labs:  CBC: Recent Labs    08/25/21 0228 08/25/21 1125 08/26/21 0731 08/26/21 1311 08/28/21 1450 08/29/21 0328 08/29/21 2058 08/31/21 0915  WBC 9.9  --  8.1  --  17.1* 14.3*  --   --   HGB 8.4*   < > 7.2*   < > 7.7* 6.3* 9.2* 8.4*  HCT 26.1*   < >  22.4*   < > 23.3* 19.3* 27.8* 25.9*  PLT 211  --  207  --  134* 135*  --   --    < > = values in this interval not displayed.    COAGS: Recent Labs    08/24/21 1450  INR 1.1    BMP: Recent Labs    08/25/21 0228 08/25/21 1844 08/26/21 0647 08/28/21 1450 08/29/21 0328  NA 139  --  137 139 138  K 4.2  --  3.5 3.9 3.6  CL 107  --  108 109 106  CO2 24  --  23 25 24   GLUCOSE 101*  --  114* 145* 125*  BUN 12 8 5* 11 13  CALCIUM 9.1  --  8.6* 8.3* 8.4*  CREATININE 0.53  --  0.65 0.80 0.73  GFRNONAA >60  --  >60 >60 >60    LIVER FUNCTION TESTS: Recent Labs    08/24/21 1450 08/26/21 0647  BILITOT 0.6 0.1*  AST 17 18  ALT 10 10  ALKPHOS 33* 30*  PROT 6.2* 5.6*  ALBUMIN 3.2* 2.8*    Assessment and Plan:  75 y/o F with history of diverticulitis found to have active GI bleeding per CTA on 08/27/21 and subsequently underwent an arteriogram with single coil embolization of the fourth order descending branch supplying the transverse colon on 9/6 in IR seen today for follow up.  Patient denies any complaints, she is hemodynamically stable, tolerating diet, no further hematochezia/melena/rectal bleeding so far today although a bloody BM was noted yesterday which patient describes as dark maroon colored without bright red blood. Hgb noted to be 8.4 this morning, previously 9.2 yesterday - she is receiving 1 unit pRBCs per primary team.  Primary team planning for possible d/c today, no IR concerns that this time which would prevent that however discussed with hospitalist about considering touching base with GI prior to d/c to ensure there were no further concerns from their  perspective.  No further IR follow up needed at this time, if there are concerns for re-bleeding would recommend obtaining a NM tagged bleeding study for further evaluation.  Please call with any questions or concerns.  Electronically Signed: Villa Herb, PA-C 08/31/2021, 3:20 PM   I spent a total of 15 Minutes at the the patient's bedside AND on the patient's hospital floor or unit, greater than 50% of which was counseling/coordinating care for GI bleed follow up.

## 2021-09-01 LAB — TYPE AND SCREEN
ABO/RH(D): O POS
Antibody Screen: NEGATIVE
Unit division: 0
Unit division: 0
Unit division: 0

## 2021-09-01 LAB — BPAM RBC
Blood Product Expiration Date: 202210062359
Blood Product Expiration Date: 202210062359
Blood Product Expiration Date: 202210122359
ISSUE DATE / TIME: 202209070839
ISSUE DATE / TIME: 202209071142
ISSUE DATE / TIME: 202209091222
Unit Type and Rh: 5100
Unit Type and Rh: 5100
Unit Type and Rh: 5100

## 2021-09-07 DIAGNOSIS — K922 Gastrointestinal hemorrhage, unspecified: Secondary | ICD-10-CM | POA: Diagnosis not present

## 2021-10-10 DIAGNOSIS — D62 Acute posthemorrhagic anemia: Secondary | ICD-10-CM | POA: Diagnosis not present

## 2021-10-10 DIAGNOSIS — K5730 Diverticulosis of large intestine without perforation or abscess without bleeding: Secondary | ICD-10-CM | POA: Diagnosis not present

## 2021-10-26 DIAGNOSIS — N390 Urinary tract infection, site not specified: Secondary | ICD-10-CM | POA: Diagnosis not present

## 2021-11-09 DIAGNOSIS — N390 Urinary tract infection, site not specified: Secondary | ICD-10-CM | POA: Diagnosis not present

## 2021-11-14 DIAGNOSIS — E782 Mixed hyperlipidemia: Secondary | ICD-10-CM | POA: Diagnosis not present

## 2021-11-14 DIAGNOSIS — J309 Allergic rhinitis, unspecified: Secondary | ICD-10-CM | POA: Diagnosis not present

## 2021-11-14 DIAGNOSIS — Z79899 Other long term (current) drug therapy: Secondary | ICD-10-CM | POA: Diagnosis not present

## 2021-11-14 DIAGNOSIS — E559 Vitamin D deficiency, unspecified: Secondary | ICD-10-CM | POA: Diagnosis not present

## 2021-11-14 DIAGNOSIS — J4520 Mild intermittent asthma, uncomplicated: Secondary | ICD-10-CM | POA: Diagnosis not present

## 2021-11-14 DIAGNOSIS — Z8249 Family history of ischemic heart disease and other diseases of the circulatory system: Secondary | ICD-10-CM | POA: Diagnosis not present

## 2021-11-14 DIAGNOSIS — Z1389 Encounter for screening for other disorder: Secondary | ICD-10-CM | POA: Diagnosis not present

## 2021-11-14 DIAGNOSIS — M19042 Primary osteoarthritis, left hand: Secondary | ICD-10-CM | POA: Diagnosis not present

## 2021-11-14 DIAGNOSIS — R7309 Other abnormal glucose: Secondary | ICD-10-CM | POA: Diagnosis not present

## 2021-11-14 DIAGNOSIS — Z7189 Other specified counseling: Secondary | ICD-10-CM | POA: Diagnosis not present

## 2021-11-14 DIAGNOSIS — Z0000 Encounter for general adult medical examination without abnormal findings: Secondary | ICD-10-CM | POA: Diagnosis not present

## 2021-11-14 DIAGNOSIS — R413 Other amnesia: Secondary | ICD-10-CM | POA: Diagnosis not present

## 2021-11-14 DIAGNOSIS — E669 Obesity, unspecified: Secondary | ICD-10-CM | POA: Diagnosis not present

## 2021-12-04 DIAGNOSIS — E669 Obesity, unspecified: Secondary | ICD-10-CM | POA: Diagnosis not present

## 2021-12-04 DIAGNOSIS — E782 Mixed hyperlipidemia: Secondary | ICD-10-CM | POA: Diagnosis not present

## 2021-12-04 DIAGNOSIS — R7303 Prediabetes: Secondary | ICD-10-CM | POA: Diagnosis not present

## 2021-12-04 DIAGNOSIS — K219 Gastro-esophageal reflux disease without esophagitis: Secondary | ICD-10-CM | POA: Diagnosis not present

## 2021-12-25 ENCOUNTER — Other Ambulatory Visit: Payer: Self-pay | Admitting: Internal Medicine

## 2021-12-25 DIAGNOSIS — Z1231 Encounter for screening mammogram for malignant neoplasm of breast: Secondary | ICD-10-CM

## 2022-01-08 DIAGNOSIS — E782 Mixed hyperlipidemia: Secondary | ICD-10-CM | POA: Diagnosis not present

## 2022-01-21 DIAGNOSIS — E669 Obesity, unspecified: Secondary | ICD-10-CM | POA: Diagnosis not present

## 2022-01-21 DIAGNOSIS — K219 Gastro-esophageal reflux disease without esophagitis: Secondary | ICD-10-CM | POA: Diagnosis not present

## 2022-01-21 DIAGNOSIS — G720 Drug-induced myopathy: Secondary | ICD-10-CM | POA: Diagnosis not present

## 2022-01-21 DIAGNOSIS — R7303 Prediabetes: Secondary | ICD-10-CM | POA: Diagnosis not present

## 2022-01-21 DIAGNOSIS — E782 Mixed hyperlipidemia: Secondary | ICD-10-CM | POA: Diagnosis not present

## 2022-01-22 ENCOUNTER — Ambulatory Visit
Admission: RE | Admit: 2022-01-22 | Discharge: 2022-01-22 | Disposition: A | Discharge status: Home / Self Care | Payer: PPO | Source: Ambulatory Visit | Attending: Internal Medicine | Admitting: Internal Medicine

## 2022-01-22 DIAGNOSIS — Z1231 Encounter for screening mammogram for malignant neoplasm of breast: Secondary | ICD-10-CM | POA: Diagnosis not present

## 2022-02-26 DIAGNOSIS — E669 Obesity, unspecified: Secondary | ICD-10-CM | POA: Diagnosis not present

## 2022-02-26 DIAGNOSIS — E782 Mixed hyperlipidemia: Secondary | ICD-10-CM | POA: Diagnosis not present

## 2022-04-18 DIAGNOSIS — H35033 Hypertensive retinopathy, bilateral: Secondary | ICD-10-CM | POA: Diagnosis not present

## 2022-04-18 DIAGNOSIS — H524 Presbyopia: Secondary | ICD-10-CM | POA: Diagnosis not present

## 2022-04-18 DIAGNOSIS — H26491 Other secondary cataract, right eye: Secondary | ICD-10-CM | POA: Diagnosis not present

## 2022-04-18 DIAGNOSIS — H35013 Changes in retinal vascular appearance, bilateral: Secondary | ICD-10-CM | POA: Diagnosis not present

## 2022-04-18 DIAGNOSIS — H35363 Drusen (degenerative) of macula, bilateral: Secondary | ICD-10-CM | POA: Diagnosis not present

## 2022-04-18 DIAGNOSIS — H26492 Other secondary cataract, left eye: Secondary | ICD-10-CM | POA: Diagnosis not present

## 2022-05-14 DIAGNOSIS — R7303 Prediabetes: Secondary | ICD-10-CM | POA: Diagnosis not present

## 2022-05-14 DIAGNOSIS — R413 Other amnesia: Secondary | ICD-10-CM | POA: Diagnosis not present

## 2022-05-14 DIAGNOSIS — E782 Mixed hyperlipidemia: Secondary | ICD-10-CM | POA: Diagnosis not present

## 2022-05-14 DIAGNOSIS — E669 Obesity, unspecified: Secondary | ICD-10-CM | POA: Diagnosis not present

## 2022-05-14 DIAGNOSIS — K219 Gastro-esophageal reflux disease without esophagitis: Secondary | ICD-10-CM | POA: Diagnosis not present

## 2022-05-14 DIAGNOSIS — G720 Drug-induced myopathy: Secondary | ICD-10-CM | POA: Diagnosis not present

## 2022-05-28 DIAGNOSIS — R7303 Prediabetes: Secondary | ICD-10-CM | POA: Diagnosis not present

## 2022-05-28 DIAGNOSIS — Z6831 Body mass index (BMI) 31.0-31.9, adult: Secondary | ICD-10-CM | POA: Diagnosis not present

## 2022-05-28 DIAGNOSIS — E669 Obesity, unspecified: Secondary | ICD-10-CM | POA: Diagnosis not present

## 2022-05-28 DIAGNOSIS — E782 Mixed hyperlipidemia: Secondary | ICD-10-CM | POA: Diagnosis not present

## 2022-08-05 DIAGNOSIS — H524 Presbyopia: Secondary | ICD-10-CM | POA: Diagnosis not present

## 2022-08-07 DIAGNOSIS — G720 Drug-induced myopathy: Secondary | ICD-10-CM | POA: Diagnosis not present

## 2022-08-07 DIAGNOSIS — R7303 Prediabetes: Secondary | ICD-10-CM | POA: Diagnosis not present

## 2022-08-07 DIAGNOSIS — K219 Gastro-esophageal reflux disease without esophagitis: Secondary | ICD-10-CM | POA: Diagnosis not present

## 2022-08-07 DIAGNOSIS — R7309 Other abnormal glucose: Secondary | ICD-10-CM | POA: Diagnosis not present

## 2022-08-07 DIAGNOSIS — E782 Mixed hyperlipidemia: Secondary | ICD-10-CM | POA: Diagnosis not present

## 2022-08-07 DIAGNOSIS — E669 Obesity, unspecified: Secondary | ICD-10-CM | POA: Diagnosis not present

## 2022-08-07 DIAGNOSIS — R413 Other amnesia: Secondary | ICD-10-CM | POA: Diagnosis not present

## 2022-08-13 DIAGNOSIS — Z6831 Body mass index (BMI) 31.0-31.9, adult: Secondary | ICD-10-CM | POA: Diagnosis not present

## 2022-08-13 DIAGNOSIS — E669 Obesity, unspecified: Secondary | ICD-10-CM | POA: Diagnosis not present

## 2022-08-13 DIAGNOSIS — E782 Mixed hyperlipidemia: Secondary | ICD-10-CM | POA: Diagnosis not present

## 2022-08-13 DIAGNOSIS — R7303 Prediabetes: Secondary | ICD-10-CM | POA: Diagnosis not present

## 2022-08-13 DIAGNOSIS — E559 Vitamin D deficiency, unspecified: Secondary | ICD-10-CM | POA: Diagnosis not present

## 2022-08-26 ENCOUNTER — Other Ambulatory Visit: Payer: Self-pay | Admitting: Internal Medicine

## 2022-08-26 DIAGNOSIS — D62 Acute posthemorrhagic anemia: Secondary | ICD-10-CM

## 2022-10-15 DIAGNOSIS — E669 Obesity, unspecified: Secondary | ICD-10-CM | POA: Diagnosis not present

## 2022-10-15 DIAGNOSIS — E782 Mixed hyperlipidemia: Secondary | ICD-10-CM | POA: Diagnosis not present

## 2022-10-15 DIAGNOSIS — R7303 Prediabetes: Secondary | ICD-10-CM | POA: Diagnosis not present

## 2022-10-15 DIAGNOSIS — Z6831 Body mass index (BMI) 31.0-31.9, adult: Secondary | ICD-10-CM | POA: Diagnosis not present

## 2022-10-15 DIAGNOSIS — K219 Gastro-esophageal reflux disease without esophagitis: Secondary | ICD-10-CM | POA: Diagnosis not present

## 2022-10-15 DIAGNOSIS — E559 Vitamin D deficiency, unspecified: Secondary | ICD-10-CM | POA: Diagnosis not present

## 2022-12-11 DIAGNOSIS — E782 Mixed hyperlipidemia: Secondary | ICD-10-CM | POA: Diagnosis not present

## 2022-12-11 DIAGNOSIS — M85852 Other specified disorders of bone density and structure, left thigh: Secondary | ICD-10-CM | POA: Diagnosis not present

## 2022-12-11 DIAGNOSIS — E1169 Type 2 diabetes mellitus with other specified complication: Secondary | ICD-10-CM | POA: Diagnosis not present

## 2022-12-11 DIAGNOSIS — K219 Gastro-esophageal reflux disease without esophagitis: Secondary | ICD-10-CM | POA: Diagnosis not present

## 2022-12-11 DIAGNOSIS — E669 Obesity, unspecified: Secondary | ICD-10-CM | POA: Diagnosis not present

## 2022-12-11 DIAGNOSIS — R413 Other amnesia: Secondary | ICD-10-CM | POA: Diagnosis not present

## 2022-12-11 DIAGNOSIS — G720 Drug-induced myopathy: Secondary | ICD-10-CM | POA: Diagnosis not present

## 2022-12-11 DIAGNOSIS — Z79899 Other long term (current) drug therapy: Secondary | ICD-10-CM | POA: Diagnosis not present

## 2022-12-11 DIAGNOSIS — Z0000 Encounter for general adult medical examination without abnormal findings: Secondary | ICD-10-CM | POA: Diagnosis not present

## 2022-12-31 DIAGNOSIS — M85852 Other specified disorders of bone density and structure, left thigh: Secondary | ICD-10-CM | POA: Diagnosis not present

## 2022-12-31 DIAGNOSIS — M85851 Other specified disorders of bone density and structure, right thigh: Secondary | ICD-10-CM | POA: Diagnosis not present

## 2022-12-31 DIAGNOSIS — Z780 Asymptomatic menopausal state: Secondary | ICD-10-CM | POA: Diagnosis not present

## 2023-02-11 DIAGNOSIS — E782 Mixed hyperlipidemia: Secondary | ICD-10-CM | POA: Diagnosis not present

## 2023-02-11 DIAGNOSIS — Z5181 Encounter for therapeutic drug level monitoring: Secondary | ICD-10-CM | POA: Diagnosis not present

## 2023-02-18 ENCOUNTER — Other Ambulatory Visit (HOSPITAL_COMMUNITY): Payer: Self-pay | Admitting: Internal Medicine

## 2023-02-18 DIAGNOSIS — E782 Mixed hyperlipidemia: Secondary | ICD-10-CM

## 2023-02-20 ENCOUNTER — Other Ambulatory Visit: Payer: Self-pay | Admitting: Internal Medicine

## 2023-02-20 DIAGNOSIS — Z1231 Encounter for screening mammogram for malignant neoplasm of breast: Secondary | ICD-10-CM

## 2023-02-21 ENCOUNTER — Ambulatory Visit
Admission: RE | Admit: 2023-02-21 | Discharge: 2023-02-21 | Disposition: A | Discharge status: Home / Self Care | Payer: PPO | Source: Ambulatory Visit | Attending: Internal Medicine | Admitting: Internal Medicine

## 2023-02-21 DIAGNOSIS — Z1231 Encounter for screening mammogram for malignant neoplasm of breast: Secondary | ICD-10-CM | POA: Diagnosis not present

## 2023-03-26 ENCOUNTER — Ambulatory Visit (HOSPITAL_COMMUNITY)
Admission: RE | Admit: 2023-03-26 | Discharge: 2023-03-26 | Disposition: A | Discharge status: Home / Self Care | Payer: PPO | Source: Ambulatory Visit | Attending: Internal Medicine | Admitting: Internal Medicine

## 2023-03-26 DIAGNOSIS — E782 Mixed hyperlipidemia: Secondary | ICD-10-CM | POA: Insufficient documentation

## 2023-04-22 DIAGNOSIS — H26493 Other secondary cataract, bilateral: Secondary | ICD-10-CM | POA: Diagnosis not present

## 2023-04-22 DIAGNOSIS — H35013 Changes in retinal vascular appearance, bilateral: Secondary | ICD-10-CM | POA: Diagnosis not present

## 2023-04-22 DIAGNOSIS — H35363 Drusen (degenerative) of macula, bilateral: Secondary | ICD-10-CM | POA: Diagnosis not present

## 2023-04-22 DIAGNOSIS — H35033 Hypertensive retinopathy, bilateral: Secondary | ICD-10-CM | POA: Diagnosis not present

## 2023-05-21 DIAGNOSIS — H26493 Other secondary cataract, bilateral: Secondary | ICD-10-CM | POA: Diagnosis not present

## 2023-05-21 DIAGNOSIS — H524 Presbyopia: Secondary | ICD-10-CM | POA: Diagnosis not present

## 2023-05-21 DIAGNOSIS — H18513 Endothelial corneal dystrophy, bilateral: Secondary | ICD-10-CM | POA: Diagnosis not present

## 2023-05-21 DIAGNOSIS — H35363 Drusen (degenerative) of macula, bilateral: Secondary | ICD-10-CM | POA: Diagnosis not present

## 2023-05-21 DIAGNOSIS — H35033 Hypertensive retinopathy, bilateral: Secondary | ICD-10-CM | POA: Diagnosis not present

## 2023-05-22 IMAGING — US IR EMBO ART  VEN HEMORR LYMPH EXTRAV  INC GUIDE ROADMAPPING
1 series · 2 of 2 positions shown · non-contrast
Comparison: none

INDICATION: Lower GI bleed and hypotension with positive CT angiography
demonstrating active extravasation of contrast from a branch
supplying the distal transverse colon. The patient has a single
current functioning IV through which she is receiving blood. There
has been inability to establish a second peripheral IV.

[Series 1: ir embo art ven hemorr lymph extrav inc guide road · 2 of 2 slices shown]
[im 1/2]
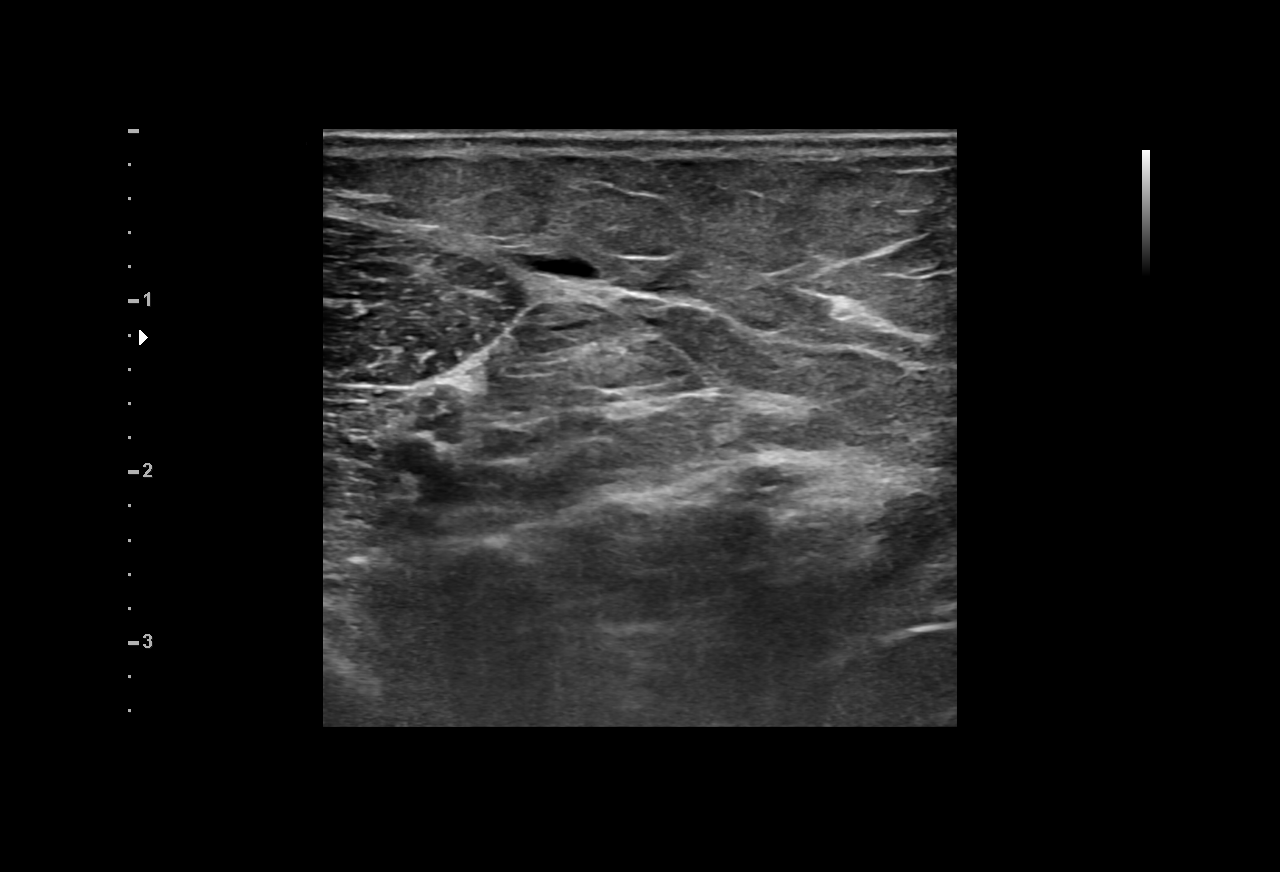
[im 2/2]
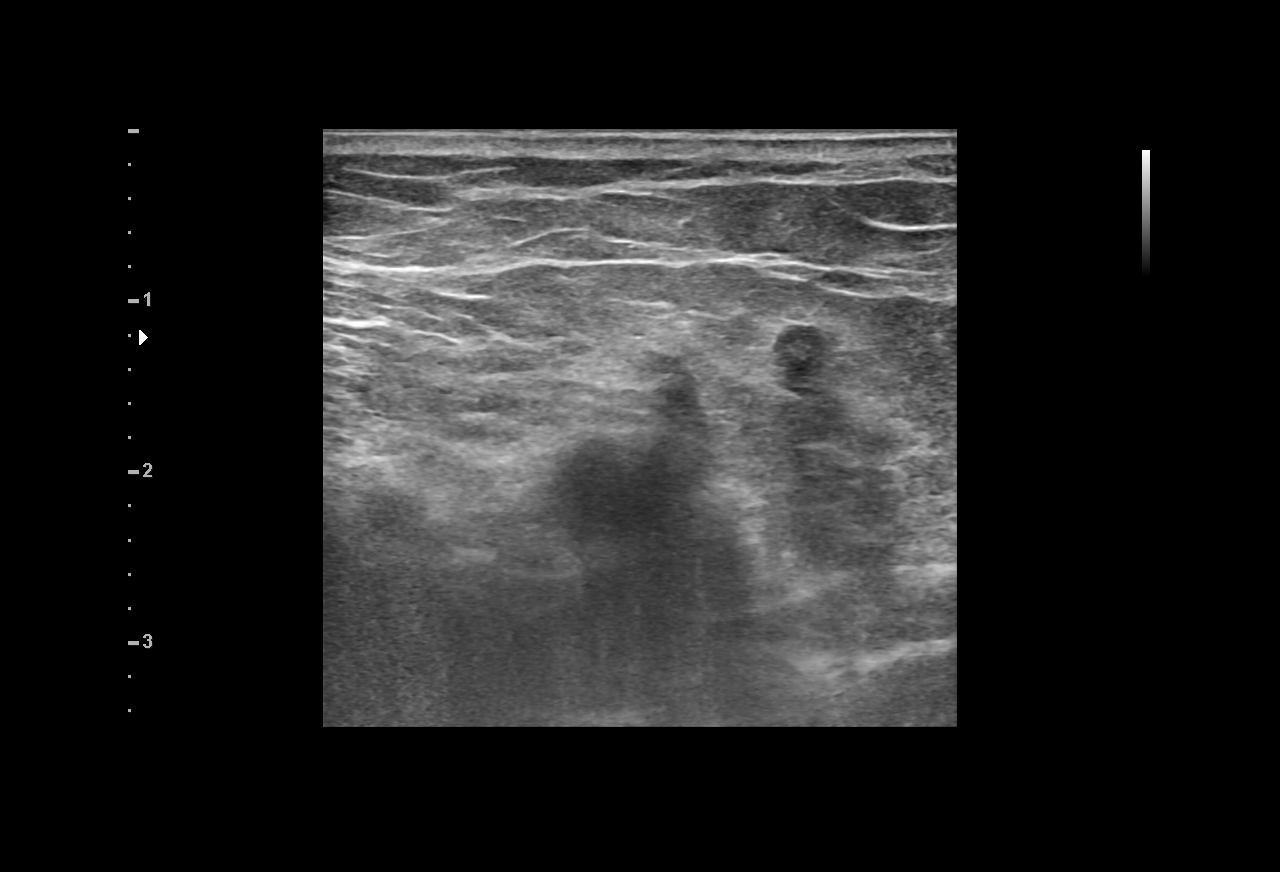

[2 of 2 positions shown; findings below may reference images not displayed]

EXAM:
1. ULTRASOUND-GUIDED PERIPHERAL INTRAVENOUS ACCESS
2. ULTRASOUND GUIDANCE FOR VASCULAR ACCESS OF THE RIGHT COMMON
FEMORAL ARTERY
3. SELECTIVE ARTERIOGRAPHY OF THE SUPERIOR MESENTERIC ARTERY
4. ADDITIONAL SELECTIVE ARTERIOGRAPHY OF A THIRD ORDER SMA BRANCH
5. ADDITIONAL SELECTIVE ARTERIOGRAPHY A THIRD ORDER SMA BRANCH
6. ADDITIONAL SELECTIVE ARTERIOGRAPHY A FOURTH ORDER SMA BRANCH
7. TRANSCATHETER EMBOLIZATION OF SMA BRANCH
8. FOLLOW-UP ARTERIOGRAPHY AFTER EMBOLIZATION

MEDICATIONS:
None

ANESTHESIA/SEDATION:
Moderate (conscious) sedation was employed during this procedure. A
total of Versed 3.0 mg and Fentanyl 100 mcg was administered
intravenously.

Moderate Sedation Time: 99 minutes. The patient's level of
consciousness and vital signs were monitored continuously by
radiology nursing throughout the procedure under my direct
supervision.

CONTRAST:  75 mL Omnipaque 350

FLUOROSCOPY TIME:  Fluoroscopy Time: 33 minutes.  716 mGy

COMPLICATIONS:
None immediate.

PROCEDURE:
Informed consent was obtained from the patient's husband following
explanation of the procedure, risks, benefits and alternatives. The
patient and her husband understand, agree and consent for the
procedure. All questions were addressed. The husband signed consent
due to patient history of history of mild dementia. A time out was
performed prior to the initiation of the procedure. Maximal barrier
sterile technique utilized including caps, mask, sterile gowns,
sterile gloves, large sterile drape, hand hygiene, and chlorhexidine
prep.

Ultrasound was used to confirm patency of the right basilic vein in
the upper arm. Local anesthesia was provided with 1% lidocaine.
Under ultrasound guidance, a 21 gauge needle was utilized to access
the vein. Ultrasound image documentation was performed. A guidewire
was advanced. A 4 French micropuncture dilator was advanced into the
vein and secured for use as intravenous access.

Ultrasound was used to confirm patency of the right common femoral
artery. Local anesthesia was provided with 1% lidocaine. Under
ultrasound guidance, a 21 gauge needle was advanced into the right
common femoral artery. Ultrasound image documentation was performed.
After placement of a micropuncture dilator, a 5 French sheath was
then placed over a guidewire. A 5 French Cobra catheter was advanced
into the abdominal aorta and used to selectively catheterize the
superior mesenteric artery. Selective arteriography of the superior
mesenteric artery was performed in different projections.

A microcatheter was then advanced through the 5 French catheter and
into the trunk of the superior mesenteric artery. A third order
branch vessel was catheterized and selective arteriography
performed. A separate second order SMA branch was then catheterized
with a microcatheter followed by further catheterization of a third
order branch. Selective arteriography was performed. The
microcatheter was then used to further catheterize a fourth order
branch. Selective arteriography was performed. Transcatheter
embolization was performed with deployment of a single 1 mm x 2 cm
Ruby LP coil. Additional follow-up arteriography was performed.

After catheter removal and sheath removal, hemostasis was obtained
with use of the Cordis ExoSeal device.
FINDINGS: Intravenous access was successfully stab list at the level of the
right basilic vein in the upper arm. This access was utilized for
conscious sedation during the procedure and was also kept in place
for use during hospital admission.

Initial selective arteriography of the superior mesenteric artery
demonstrated normal patency of branch vessels without evidence of
active bleeding, pseudoaneurysm, vascular malformation or other
vascular abnormality. On the third selective SMA arteriogram, there
was evidence active contrast extravasation at the level of the mid
to distal transverse colon in the region of bleeding identified by
CT angiography.

Initial catheterization of a branch vessel with a microcatheter was
at the level a small bowel branch which demonstrates a small focal
branch vessel aneurysm without evidence of hemorrhage. After mapping
out the probable route of supply to the transverse colon, a tortuous
proximal branch was eventually able to be catheterized that supplies
the transverse colon. Initial selective arteriography at the level
of this branch did not re-demonstrate active bleeding. However, with
further advancement of the catheter into the origin of a fourth
order descending transverse colonic branch, active extravasation of
contrast material was again demonstrated. The microcatheter could
not be advanced beyond the origin of this branch vessel due to
tortuosity and small vessel caliber as well as probable component of
spasm. Therefore, coil embolization was performed at the base of
this vessel resulting in occlusion as well as some additional branch
vessels distal to the coil. Follow-up arteriography did not
demonstrate active extravasation at the level of previously
demonstrated bleeding.
IMPRESSION: 1. Additional peripheral intravenous access established under
ultrasound guidance for use for IV conscious sedation during the
procedure.
2. Arteriography of the superior mesenteric artery as well as
multiple distal branch vessel supply demonstrates active contrast
extravasation from a fourth order descending branch supplying the
transverse colon at the level of active bleeding identified by CT
angiography. The origin of this branch vessel was able to be
successfully catheterized with deployment of a single embolization
coil resulting in branch vessel occlusion and lack of contrast
extravasation following embolization.

## 2023-05-22 IMAGING — CT CTA GI BLEED
2 of 16 series · 10 of 46 positions shown, 17 images · IV contrast (APPLIED)
Comparison: Abdominal CTA 2 days ago 08/25/2021

CLINICAL DATA: GI bleed.

EXAM:
CTA ABDOMEN AND PELVIS WITHOUT AND WITH CONTRAST
TECHNIQUE: Multidetector CT imaging of the abdomen and pelvis was performed
using the standard protocol during bolus administration of
intravenous contrast. Multiplanar reconstructed images and MIPs were
obtained and reviewed to evaluate the vascular anatomy.
CONTRAST:  100mL OMNIPAQUE IOHEXOL 350 MG/ML SOLN

[Series 13: cor · coronal · 0.80mm/px · 1 of 138 slices shown, 2 images]
[im 69/138  soft-tissue]
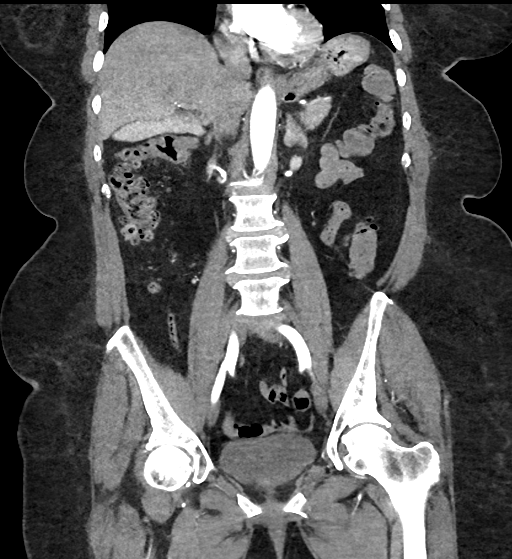
[im 69/138  bone]
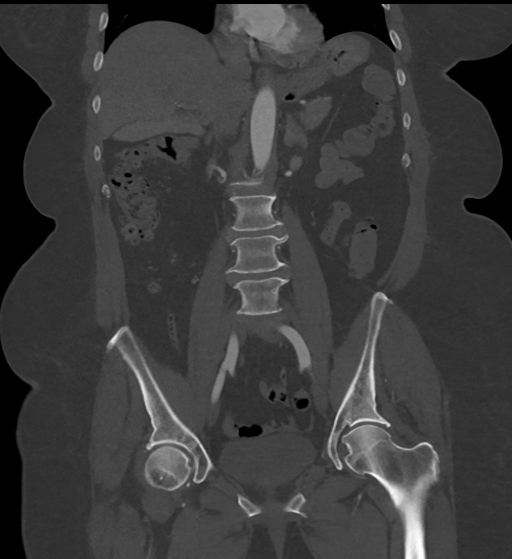

[Series 18: venous thins · axial · portal-venous · 0.81mm/px · z∈[-191,+176]mm · 9 of 1123 slices shown, 15 images]
[im 103/1123  soft-tissue]
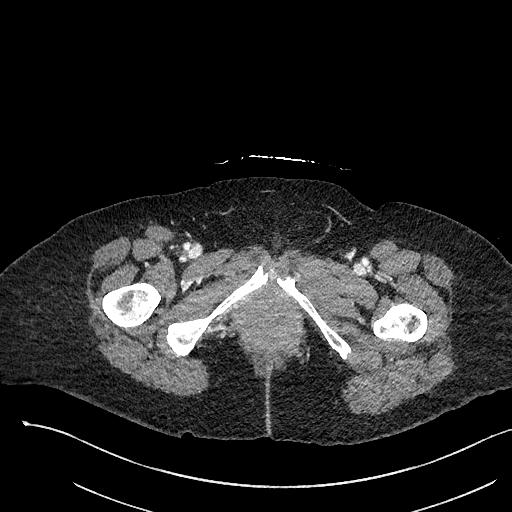
[im 103/1123  bone]
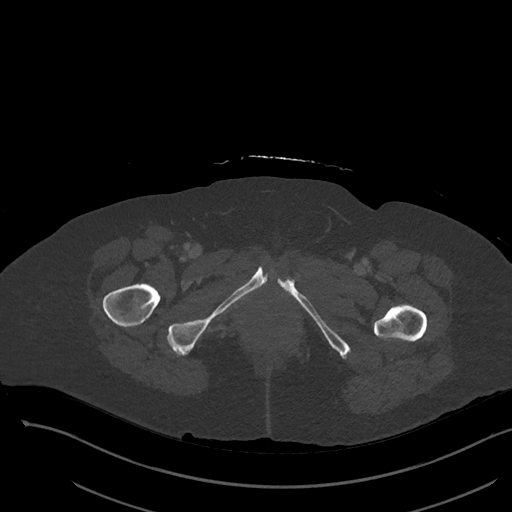
[im 205/1123  soft-tissue]
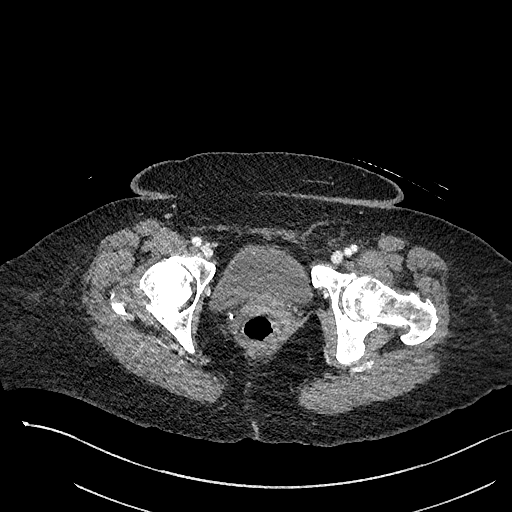
[im 307/1123  soft-tissue]
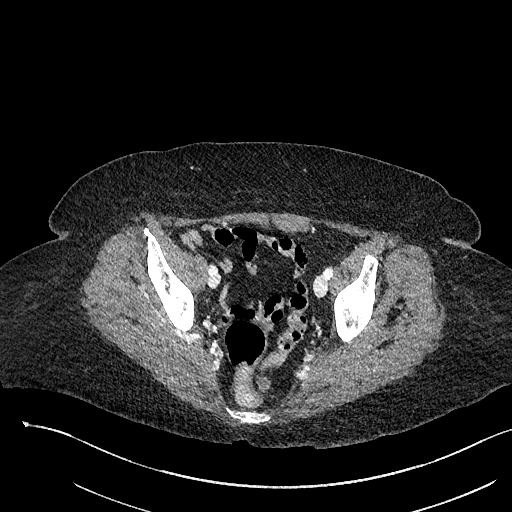
[im 409/1123  soft-tissue]
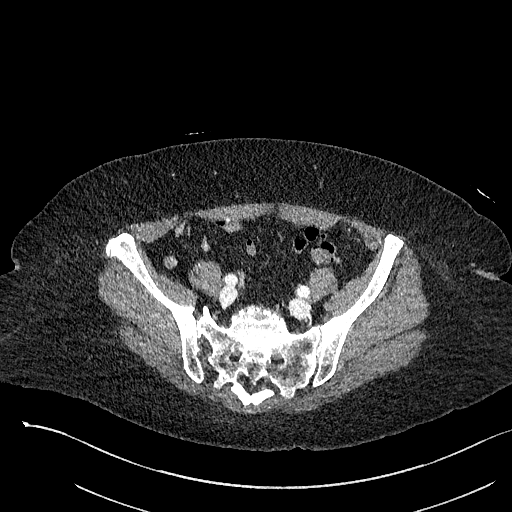
[im 613/1123  soft-tissue]
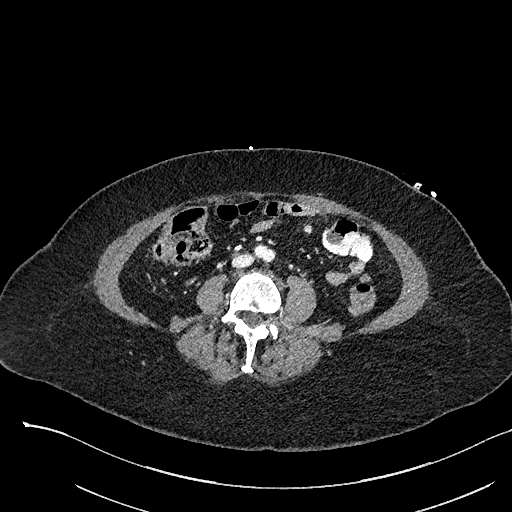
[im 715/1123  soft-tissue]
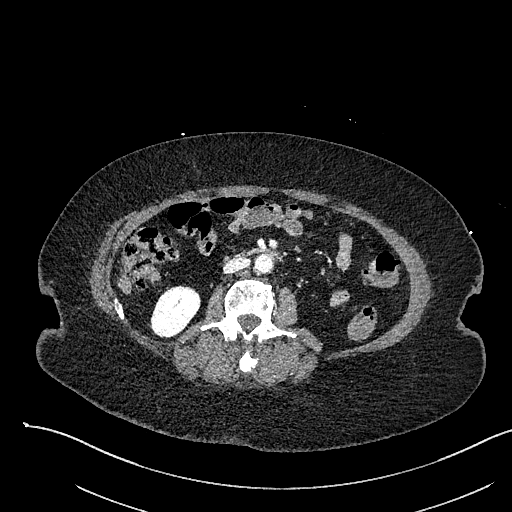
[im 715/1123  lung]
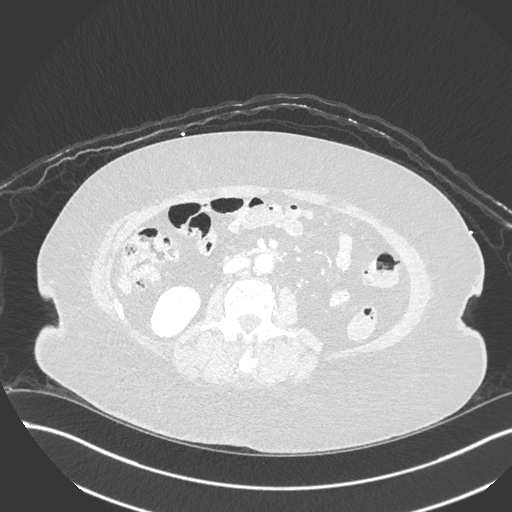
[im 817/1123  soft-tissue]
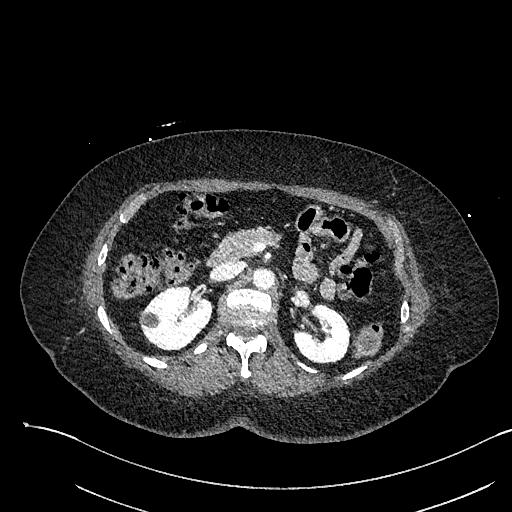
[im 817/1123  lung]
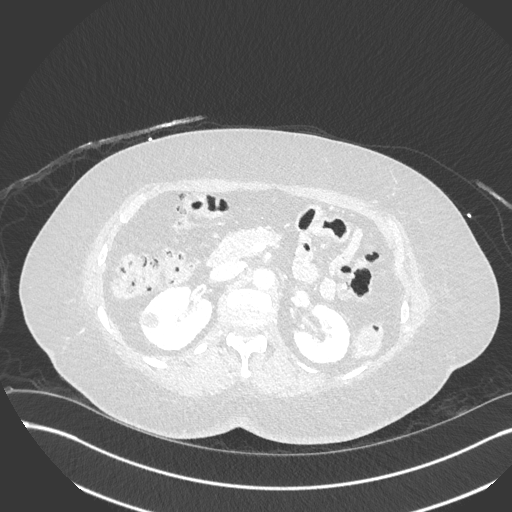
[im 919/1123  soft-tissue]
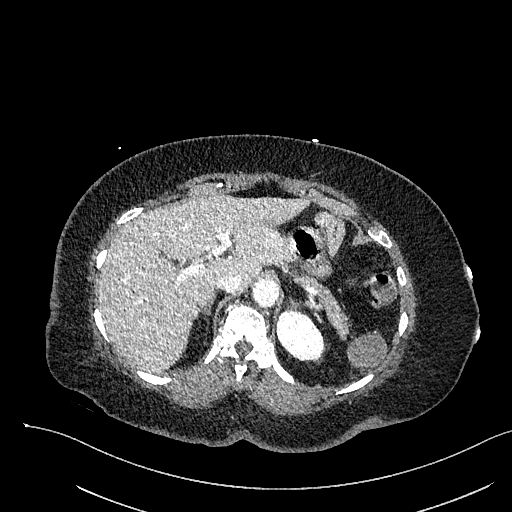
[im 919/1123  lung]
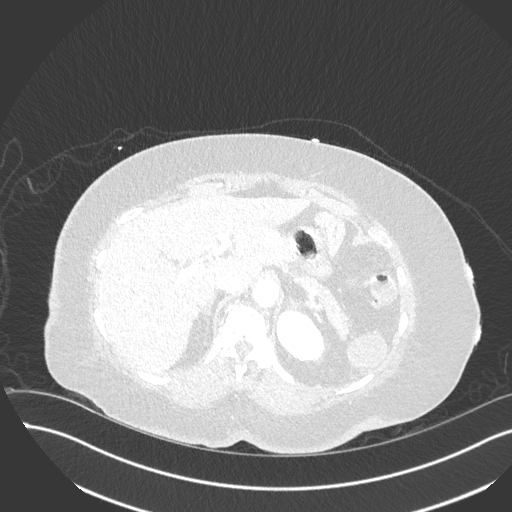
[im 1021/1123  soft-tissue]
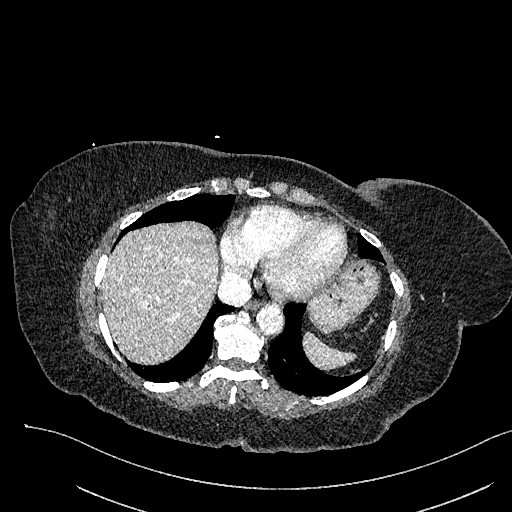
[im 1021/1123  lung]
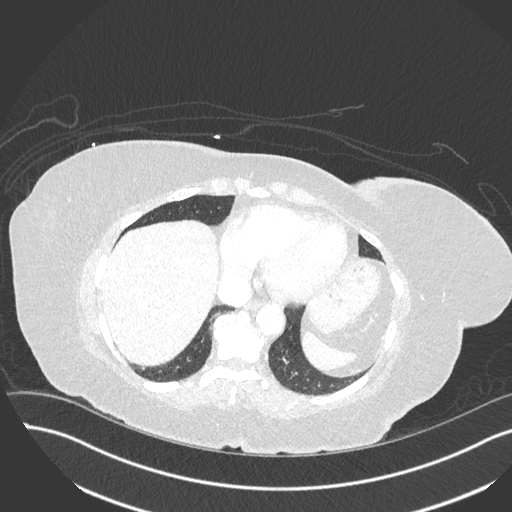
[im 1021/1123  bone]
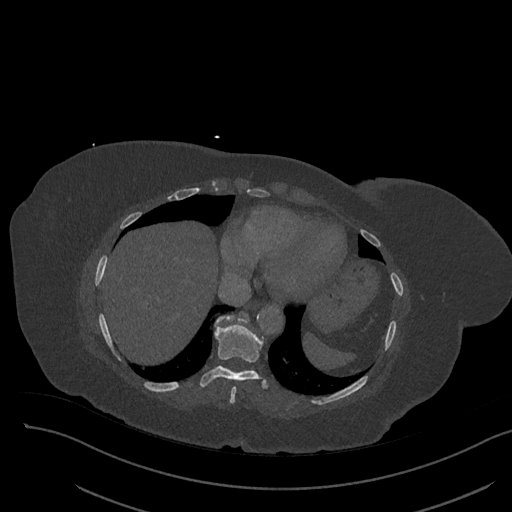

[10 of 46 positions shown; findings below may reference images not displayed]

FINDINGS: VASCULAR

Aorta: Mild atherosclerosis. No aneurysm, dissection, or significant
stenosis. No periaortic stranding or inflammation.

Celiac: The right hepatic artery has a separate origin arising from
the abdominal aorta. Celiac artery is patent. Distal branch vessels
are patent.

SMA: Patent without evidence of aneurysm, dissection, vasculitis or
significant stenosis.

Renals: Mild calcified plaque in the origin of the renal arteries
without significant stenosis. No acute findings or change from
prior. Single bilateral renal arteries.

IMA: Patent.

Inflow: Patent without evidence of aneurysm, dissection, vasculitis
or significant stenosis. Iliac tortuosity again seen.

Proximal Outflow: Bilateral common femoral and visualized portions
of the superficial and profunda femoral arteries are patent without
evidence of aneurysm, dissection, vasculitis or significant
stenosis.

Veins: No obvious venous abnormality within the limitations of this
arterial phase study.

Review of the MIP images confirms the above findings.

NON-VASCULAR

Lower chest: Lung bases are clear. No acute airspace disease or
pleural effusion.

Hepatobiliary: No focal hepatic abnormality. There is vicarious
excretion of IV contrast in the gallbladder accounting for
hyperdense appearance on the current exam. No biliary dilatation or
pericholecystic inflammation.

Pancreas: No ductal dilatation or inflammation.

Spleen: Normal in size without focal abnormality.

Adrenals/Urinary Tract: No adrenal nodule. Homogeneous renal
enhancement without hydronephrosis. Again seen right renal cysts. No
suspicious renal lesion. Unremarkable urinary bladder.

Stomach/Bowel: Examination is positive for active extravasation of
contrast into the GI tract. There is extravasation of contrast into
the mid transverse colon, series 10, image 94, that increases on
venous phase. No additional sites of IV contrast extravasation.
Diffuse colonic diverticulosis without diverticulitis. Normal
appendix.

Lymphatic: No abdominopelvic adenopathy.

Reproductive: Partial hysterectomy with prominence of the vaginal
cuff unchanged. No adnexal mass.

Other: No ascites or free air. Small fat containing umbilical
hernia. Small bilateral fat containing inguinal hernias.

Musculoskeletal: There are no acute or suspicious osseous
abnormalities. Transitional lumbosacral anatomy with enlarged
transverse processes and pseudoarticulation of the sacrum with L5.
Mild bilateral hip osteoarthritis.
IMPRESSION: VASCULAR

1. Examination is positive for active GI bleed in the mid distal
transverse colon.
2. Mild aortic atherosclerosis.

NON-VASCULAR

1. Colonic diverticulosis without focal diverticulitis.
2. Small fat containing bilateral inguinal and umbilical hernias.

## 2023-06-25 DIAGNOSIS — Z79899 Other long term (current) drug therapy: Secondary | ICD-10-CM | POA: Diagnosis not present

## 2023-06-25 DIAGNOSIS — E782 Mixed hyperlipidemia: Secondary | ICD-10-CM | POA: Diagnosis not present

## 2023-06-25 DIAGNOSIS — E1169 Type 2 diabetes mellitus with other specified complication: Secondary | ICD-10-CM | POA: Diagnosis not present

## 2023-06-25 DIAGNOSIS — E119 Type 2 diabetes mellitus without complications: Secondary | ICD-10-CM | POA: Diagnosis not present

## 2023-06-25 DIAGNOSIS — M85852 Other specified disorders of bone density and structure, left thigh: Secondary | ICD-10-CM | POA: Diagnosis not present

## 2023-06-25 DIAGNOSIS — I700 Atherosclerosis of aorta: Secondary | ICD-10-CM | POA: Diagnosis not present

## 2023-06-25 DIAGNOSIS — F03A0 Unspecified dementia, mild, without behavioral disturbance, psychotic disturbance, mood disturbance, and anxiety: Secondary | ICD-10-CM | POA: Diagnosis not present

## 2023-07-23 DIAGNOSIS — Z23 Encounter for immunization: Secondary | ICD-10-CM | POA: Diagnosis not present

## 2023-07-24 DIAGNOSIS — Z23 Encounter for immunization: Secondary | ICD-10-CM | POA: Diagnosis not present

## 2023-10-31 DIAGNOSIS — J069 Acute upper respiratory infection, unspecified: Secondary | ICD-10-CM | POA: Diagnosis not present

## 2023-10-31 DIAGNOSIS — U071 COVID-19: Secondary | ICD-10-CM | POA: Diagnosis not present

## 2024-02-10 ENCOUNTER — Other Ambulatory Visit: Payer: Self-pay | Admitting: Internal Medicine

## 2024-02-10 ENCOUNTER — Other Ambulatory Visit: Payer: Self-pay | Admitting: Pediatric Surgery

## 2024-02-10 DIAGNOSIS — Z1231 Encounter for screening mammogram for malignant neoplasm of breast: Secondary | ICD-10-CM

## 2024-03-03 ENCOUNTER — Ambulatory Visit: Payer: PPO

## 2024-03-16 ENCOUNTER — Ambulatory Visit
Admission: RE | Admit: 2024-03-16 | Discharge: 2024-03-16 | Disposition: A | Discharge status: Home / Self Care | Source: Ambulatory Visit | Attending: Internal Medicine | Admitting: Internal Medicine

## 2024-03-16 DIAGNOSIS — Z1231 Encounter for screening mammogram for malignant neoplasm of breast: Secondary | ICD-10-CM

## 2024-06-29 DIAGNOSIS — H31113 Age-related choroidal atrophy, bilateral: Secondary | ICD-10-CM | POA: Diagnosis not present

## 2024-06-29 DIAGNOSIS — H35363 Drusen (degenerative) of macula, bilateral: Secondary | ICD-10-CM | POA: Diagnosis not present

## 2024-06-29 DIAGNOSIS — H35033 Hypertensive retinopathy, bilateral: Secondary | ICD-10-CM | POA: Diagnosis not present

## 2024-06-29 DIAGNOSIS — H26493 Other secondary cataract, bilateral: Secondary | ICD-10-CM | POA: Diagnosis not present

## 2024-06-29 DIAGNOSIS — H3561 Retinal hemorrhage, right eye: Secondary | ICD-10-CM | POA: Diagnosis not present

## 2024-06-29 DIAGNOSIS — H524 Presbyopia: Secondary | ICD-10-CM | POA: Diagnosis not present

## 2024-06-30 DIAGNOSIS — E1169 Type 2 diabetes mellitus with other specified complication: Secondary | ICD-10-CM | POA: Diagnosis not present

## 2024-06-30 DIAGNOSIS — F03A0 Unspecified dementia, mild, without behavioral disturbance, psychotic disturbance, mood disturbance, and anxiety: Secondary | ICD-10-CM | POA: Diagnosis not present

## 2024-06-30 DIAGNOSIS — E782 Mixed hyperlipidemia: Secondary | ICD-10-CM | POA: Diagnosis not present
